# Patient Record
Sex: Female | Born: 1950 | Race: Black or African American | Hispanic: No | Marital: Single | Smoking: Never smoker
Health system: Southern US, Community
[De-identification: ages and names within clinical notes are randomized; demographics above are authoritative.]

## PROBLEM LIST (undated history)

## (undated) DIAGNOSIS — M1990 Unspecified osteoarthritis, unspecified site: Secondary | ICD-10-CM

## (undated) DIAGNOSIS — E785 Hyperlipidemia, unspecified: Secondary | ICD-10-CM

## (undated) DIAGNOSIS — T8859XA Other complications of anesthesia, initial encounter: Secondary | ICD-10-CM

## (undated) DIAGNOSIS — D649 Anemia, unspecified: Secondary | ICD-10-CM

## (undated) DIAGNOSIS — G709 Myoneural disorder, unspecified: Secondary | ICD-10-CM

## (undated) DIAGNOSIS — T4145XA Adverse effect of unspecified anesthetic, initial encounter: Secondary | ICD-10-CM

## (undated) DIAGNOSIS — M21372 Foot drop, left foot: Secondary | ICD-10-CM

## (undated) DIAGNOSIS — I10 Essential (primary) hypertension: Secondary | ICD-10-CM

## (undated) DIAGNOSIS — Z9289 Personal history of other medical treatment: Secondary | ICD-10-CM

## (undated) DIAGNOSIS — M069 Rheumatoid arthritis, unspecified: Secondary | ICD-10-CM

## (undated) HISTORY — DX: Unspecified osteoarthritis, unspecified site: M19.90

## (undated) HISTORY — DX: Hyperlipidemia, unspecified: E78.5

## (undated) HISTORY — DX: Anemia, unspecified: D64.9

## (undated) HISTORY — PX: ABDOMINAL HYSTERECTOMY: SHX81

## (undated) HISTORY — DX: Myoneural disorder, unspecified: G70.9

## (undated) HISTORY — DX: Rheumatoid arthritis, unspecified: M06.9

## (undated) HISTORY — DX: Essential (primary) hypertension: I10

## (undated) HISTORY — DX: Foot drop, left foot: M21.372

---

## 1997-07-24 DIAGNOSIS — M1990 Unspecified osteoarthritis, unspecified site: Secondary | ICD-10-CM

## 1997-07-24 HISTORY — DX: Unspecified osteoarthritis, unspecified site: M19.90

## 2006-04-05 ENCOUNTER — Ambulatory Visit (HOSPITAL_COMMUNITY): Admission: RE | Admit: 2006-04-05 | Discharge: 2006-04-05 | Payer: Self-pay | Admitting: Orthopaedic Surgery

## 2006-04-30 ENCOUNTER — Ambulatory Visit: Payer: Self-pay | Admitting: Family Medicine

## 2006-05-01 ENCOUNTER — Encounter (INDEPENDENT_AMBULATORY_CARE_PROVIDER_SITE_OTHER): Payer: Self-pay | Admitting: Family Medicine

## 2006-05-18 ENCOUNTER — Ambulatory Visit: Payer: Self-pay | Admitting: Family Medicine

## 2006-05-24 ENCOUNTER — Encounter (INDEPENDENT_AMBULATORY_CARE_PROVIDER_SITE_OTHER): Payer: Self-pay | Admitting: Family Medicine

## 2006-05-24 ENCOUNTER — Ambulatory Visit (HOSPITAL_COMMUNITY): Admission: RE | Admit: 2006-05-24 | Discharge: 2006-05-24 | Payer: Self-pay | Admitting: Family Medicine

## 2006-06-01 ENCOUNTER — Ambulatory Visit: Payer: Self-pay | Admitting: Family Medicine

## 2006-06-29 ENCOUNTER — Encounter (INDEPENDENT_AMBULATORY_CARE_PROVIDER_SITE_OTHER): Payer: Self-pay | Admitting: Family Medicine

## 2006-07-08 ENCOUNTER — Encounter: Payer: Self-pay | Admitting: Family Medicine

## 2006-07-08 DIAGNOSIS — I10 Essential (primary) hypertension: Secondary | ICD-10-CM | POA: Insufficient documentation

## 2006-07-19 ENCOUNTER — Ambulatory Visit: Payer: Self-pay | Admitting: Family Medicine

## 2006-08-16 ENCOUNTER — Ambulatory Visit: Payer: Self-pay | Admitting: Family Medicine

## 2006-08-17 ENCOUNTER — Encounter (INDEPENDENT_AMBULATORY_CARE_PROVIDER_SITE_OTHER): Payer: Self-pay | Admitting: Family Medicine

## 2006-08-24 ENCOUNTER — Ambulatory Visit: Payer: Self-pay | Admitting: Internal Medicine

## 2006-08-27 ENCOUNTER — Ambulatory Visit: Payer: Self-pay | Admitting: Internal Medicine

## 2006-09-10 ENCOUNTER — Encounter (INDEPENDENT_AMBULATORY_CARE_PROVIDER_SITE_OTHER): Payer: Self-pay | Admitting: *Deleted

## 2006-09-10 ENCOUNTER — Ambulatory Visit (HOSPITAL_COMMUNITY): Admission: RE | Admit: 2006-09-10 | Discharge: 2006-09-10 | Payer: Self-pay | Admitting: Internal Medicine

## 2006-09-10 ENCOUNTER — Ambulatory Visit: Payer: Self-pay | Admitting: Internal Medicine

## 2006-09-12 ENCOUNTER — Encounter (INDEPENDENT_AMBULATORY_CARE_PROVIDER_SITE_OTHER): Payer: Self-pay | Admitting: Family Medicine

## 2006-09-17 ENCOUNTER — Encounter (INDEPENDENT_AMBULATORY_CARE_PROVIDER_SITE_OTHER): Payer: Self-pay | Admitting: Family Medicine

## 2006-09-24 ENCOUNTER — Encounter (INDEPENDENT_AMBULATORY_CARE_PROVIDER_SITE_OTHER): Payer: Self-pay | Admitting: Family Medicine

## 2006-09-27 ENCOUNTER — Ambulatory Visit: Payer: Self-pay | Admitting: Family Medicine

## 2006-09-27 DIAGNOSIS — M069 Rheumatoid arthritis, unspecified: Secondary | ICD-10-CM | POA: Insufficient documentation

## 2006-09-27 DIAGNOSIS — D509 Iron deficiency anemia, unspecified: Secondary | ICD-10-CM

## 2006-10-30 ENCOUNTER — Encounter (INDEPENDENT_AMBULATORY_CARE_PROVIDER_SITE_OTHER): Payer: Self-pay | Admitting: Family Medicine

## 2006-11-07 ENCOUNTER — Ambulatory Visit: Payer: Self-pay | Admitting: Family Medicine

## 2006-11-07 LAB — CONVERTED CEMR LAB
Glucose, Bld: 244 mg/dL
Hemoglobin: 9.8 g/dL
Hgb A1c MFr Bld: 6.2 %

## 2006-11-12 ENCOUNTER — Ambulatory Visit (HOSPITAL_COMMUNITY): Admission: RE | Admit: 2006-11-12 | Discharge: 2006-11-12 | Payer: Self-pay | Admitting: Family Medicine

## 2006-11-16 ENCOUNTER — Encounter (INDEPENDENT_AMBULATORY_CARE_PROVIDER_SITE_OTHER): Payer: Self-pay | Admitting: Family Medicine

## 2007-01-01 ENCOUNTER — Encounter (INDEPENDENT_AMBULATORY_CARE_PROVIDER_SITE_OTHER): Payer: Self-pay | Admitting: Family Medicine

## 2007-02-06 ENCOUNTER — Ambulatory Visit: Payer: Self-pay | Admitting: Family Medicine

## 2007-02-06 LAB — CONVERTED CEMR LAB
Cholesterol, target level: 200 mg/dL
Glucose, Bld: 237 mg/dL
HDL goal, serum: 40 mg/dL

## 2007-02-07 ENCOUNTER — Encounter (INDEPENDENT_AMBULATORY_CARE_PROVIDER_SITE_OTHER): Payer: Self-pay | Admitting: Family Medicine

## 2007-02-08 ENCOUNTER — Encounter (INDEPENDENT_AMBULATORY_CARE_PROVIDER_SITE_OTHER): Payer: Self-pay | Admitting: Family Medicine

## 2007-02-08 LAB — CONVERTED CEMR LAB
AST: 16 units/L (ref 0–37)
Basophils Absolute: 0 10*3/uL (ref 0.0–0.1)
CO2: 20 meq/L (ref 19–32)
Creatinine, Ser: 0.76 mg/dL (ref 0.40–1.20)
Eosinophils Absolute: 0.2 10*3/uL (ref 0.0–0.7)
Eosinophils Relative: 3 % (ref 0–5)
Glucose, Bld: 171 mg/dL — ABNORMAL HIGH (ref 70–99)
Hemoglobin: 11.9 g/dL — ABNORMAL LOW (ref 12.0–15.0)
Lymphocytes Relative: 19 % (ref 12–46)
Lymphs Abs: 1.2 10*3/uL (ref 0.7–3.3)
MCHC: 31 g/dL (ref 30.0–36.0)
MCV: 93.4 fL (ref 78.0–100.0)
Monocytes Relative: 10 % (ref 3–11)
Platelets: 402 10*3/uL — ABNORMAL HIGH (ref 150–400)
Potassium: 4.3 meq/L (ref 3.5–5.3)
RBC: 4.11 M/uL (ref 3.87–5.11)
RDW: 14.6 % — ABNORMAL HIGH (ref 11.5–14.0)
Sodium: 139 meq/L (ref 135–145)
WBC: 6.3 10*3/uL (ref 4.0–10.5)

## 2007-02-14 ENCOUNTER — Ambulatory Visit (HOSPITAL_COMMUNITY): Admission: RE | Admit: 2007-02-14 | Discharge: 2007-02-14 | Payer: Self-pay | Admitting: Family Medicine

## 2007-02-14 ENCOUNTER — Encounter (INDEPENDENT_AMBULATORY_CARE_PROVIDER_SITE_OTHER): Payer: Self-pay | Admitting: Family Medicine

## 2007-02-18 ENCOUNTER — Telehealth (INDEPENDENT_AMBULATORY_CARE_PROVIDER_SITE_OTHER): Payer: Self-pay | Admitting: *Deleted

## 2007-02-26 ENCOUNTER — Ambulatory Visit: Payer: Self-pay | Admitting: Family Medicine

## 2007-02-26 DIAGNOSIS — M949 Disorder of cartilage, unspecified: Secondary | ICD-10-CM

## 2007-02-26 DIAGNOSIS — M899 Disorder of bone, unspecified: Secondary | ICD-10-CM | POA: Insufficient documentation

## 2007-04-02 ENCOUNTER — Encounter (INDEPENDENT_AMBULATORY_CARE_PROVIDER_SITE_OTHER): Payer: Self-pay | Admitting: Family Medicine

## 2007-05-01 ENCOUNTER — Encounter (INDEPENDENT_AMBULATORY_CARE_PROVIDER_SITE_OTHER): Payer: Self-pay | Admitting: Family Medicine

## 2007-05-03 ENCOUNTER — Encounter (INDEPENDENT_AMBULATORY_CARE_PROVIDER_SITE_OTHER): Payer: Self-pay | Admitting: Family Medicine

## 2007-05-15 ENCOUNTER — Ambulatory Visit: Payer: Self-pay | Admitting: Family Medicine

## 2007-06-05 ENCOUNTER — Encounter (INDEPENDENT_AMBULATORY_CARE_PROVIDER_SITE_OTHER): Payer: Self-pay | Admitting: Family Medicine

## 2007-07-09 ENCOUNTER — Encounter (INDEPENDENT_AMBULATORY_CARE_PROVIDER_SITE_OTHER): Payer: Self-pay | Admitting: Family Medicine

## 2007-08-15 ENCOUNTER — Ambulatory Visit: Payer: Self-pay | Admitting: Family Medicine

## 2007-08-16 ENCOUNTER — Telehealth (INDEPENDENT_AMBULATORY_CARE_PROVIDER_SITE_OTHER): Payer: Self-pay | Admitting: *Deleted

## 2007-08-19 ENCOUNTER — Encounter (INDEPENDENT_AMBULATORY_CARE_PROVIDER_SITE_OTHER): Payer: Self-pay | Admitting: Family Medicine

## 2007-08-20 ENCOUNTER — Encounter (INDEPENDENT_AMBULATORY_CARE_PROVIDER_SITE_OTHER): Payer: Self-pay | Admitting: Family Medicine

## 2007-08-21 ENCOUNTER — Telehealth (INDEPENDENT_AMBULATORY_CARE_PROVIDER_SITE_OTHER): Payer: Self-pay | Admitting: *Deleted

## 2007-08-21 LAB — CONVERTED CEMR LAB
ALT: 16 units/L (ref 0–35)
AST: 14 units/L (ref 0–37)
Albumin: 4 g/dL (ref 3.5–5.2)
Alkaline Phosphatase: 152 units/L — ABNORMAL HIGH (ref 39–117)
Basophils Relative: 0 % (ref 0–1)
CO2: 22 meq/L (ref 19–32)
Calcium: 9.5 mg/dL (ref 8.4–10.5)
Chloride: 103 meq/L (ref 96–112)
Creatinine, Ser: 0.67 mg/dL (ref 0.40–1.20)
Creatinine, Urine: 110.1 mg/dL
Eosinophils Absolute: 0.2 10*3/uL (ref 0.0–0.7)
Eosinophils Relative: 3 % (ref 0–5)
Glucose, Bld: 134 mg/dL — ABNORMAL HIGH (ref 70–99)
HDL: 40 mg/dL (ref >39)
Hemoglobin: 11.2 g/dL — ABNORMAL LOW (ref 12.0–15.0)
Lymphocytes Relative: 21 % (ref 12–46)
Microalb Creat Ratio: 7.6 mg/g (ref 0.0–30.0)
Microalb, Ur: 0.84 mg/dL (ref 0.00–1.89)
Monocytes Absolute: 0.9 10*3/uL (ref 0.1–1.0)
Monocytes Relative: 13 % — ABNORMAL HIGH (ref 3–12)
Neutrophils Relative %: 64 % (ref 43–77)
Potassium: 4 meq/L (ref 3.5–5.3)
RBC: 4.13 M/uL (ref 3.87–5.11)
RDW: 15.5 % (ref 11.5–15.5)
TSH: 1.273 microintl units/mL (ref 0.350–5.50)
Total CHOL/HDL Ratio: 4.1
Total Protein: 7.8 g/dL (ref 6.0–8.3)
Triglycerides: 119 mg/dL (ref <150)
VLDL: 24 mg/dL (ref 0–40)

## 2007-08-22 ENCOUNTER — Ambulatory Visit (HOSPITAL_COMMUNITY): Admission: RE | Admit: 2007-08-22 | Discharge: 2007-08-22 | Payer: Self-pay | Admitting: Family Medicine

## 2007-08-22 ENCOUNTER — Encounter (INDEPENDENT_AMBULATORY_CARE_PROVIDER_SITE_OTHER): Payer: Self-pay | Admitting: Family Medicine

## 2007-09-03 ENCOUNTER — Telehealth (INDEPENDENT_AMBULATORY_CARE_PROVIDER_SITE_OTHER): Payer: Self-pay | Admitting: *Deleted

## 2007-10-03 ENCOUNTER — Encounter (INDEPENDENT_AMBULATORY_CARE_PROVIDER_SITE_OTHER): Payer: Self-pay | Admitting: Family Medicine

## 2007-11-13 ENCOUNTER — Ambulatory Visit: Payer: Self-pay | Admitting: Family Medicine

## 2007-11-13 DIAGNOSIS — E1149 Type 2 diabetes mellitus with other diabetic neurological complication: Secondary | ICD-10-CM

## 2007-11-14 ENCOUNTER — Encounter (INDEPENDENT_AMBULATORY_CARE_PROVIDER_SITE_OTHER): Payer: Self-pay | Admitting: Family Medicine

## 2007-11-14 ENCOUNTER — Telehealth (INDEPENDENT_AMBULATORY_CARE_PROVIDER_SITE_OTHER): Payer: Self-pay | Admitting: *Deleted

## 2007-11-19 ENCOUNTER — Ambulatory Visit (HOSPITAL_COMMUNITY): Admission: RE | Admit: 2007-11-19 | Discharge: 2007-11-19 | Payer: Self-pay | Admitting: Family Medicine

## 2007-11-22 ENCOUNTER — Telehealth (INDEPENDENT_AMBULATORY_CARE_PROVIDER_SITE_OTHER): Payer: Self-pay | Admitting: *Deleted

## 2007-12-23 ENCOUNTER — Encounter (INDEPENDENT_AMBULATORY_CARE_PROVIDER_SITE_OTHER): Payer: Self-pay | Admitting: Family Medicine

## 2007-12-25 ENCOUNTER — Ambulatory Visit: Payer: Self-pay | Admitting: Family Medicine

## 2007-12-30 ENCOUNTER — Encounter (INDEPENDENT_AMBULATORY_CARE_PROVIDER_SITE_OTHER): Payer: Self-pay | Admitting: Family Medicine

## 2008-02-13 ENCOUNTER — Ambulatory Visit: Payer: Self-pay | Admitting: Family Medicine

## 2008-02-13 LAB — CONVERTED CEMR LAB: Hgb A1c MFr Bld: 6.6 %

## 2008-02-14 LAB — CONVERTED CEMR LAB
ALT: 15 units/L (ref 0–35)
Albumin: 3.9 g/dL (ref 3.5–5.2)
BUN: 11 mg/dL (ref 6–23)
Basophils Relative: 1 % (ref 0–1)
CO2: 21 meq/L (ref 19–32)
Chloride: 100 meq/L (ref 96–112)
Creatinine, Ser: 0.49 mg/dL (ref 0.40–1.20)
Eosinophils Absolute: 0.2 10*3/uL (ref 0.0–0.7)
Eosinophils Relative: 2 % (ref 0–5)
Glucose, Bld: 107 mg/dL — ABNORMAL HIGH (ref 70–99)
Hemoglobin: 10.1 g/dL — ABNORMAL LOW (ref 12.0–15.0)
MCV: 83.3 fL (ref 78.0–100.0)
Monocytes Absolute: 0.9 10*3/uL (ref 0.1–1.0)
Platelets: 508 10*3/uL — ABNORMAL HIGH (ref 150–400)
Sodium: 137 meq/L (ref 135–145)
WBC: 7 10*3/uL (ref 4.0–10.5)

## 2008-05-15 ENCOUNTER — Ambulatory Visit: Payer: Self-pay | Admitting: Family Medicine

## 2008-06-02 ENCOUNTER — Telehealth (INDEPENDENT_AMBULATORY_CARE_PROVIDER_SITE_OTHER): Payer: Self-pay | Admitting: *Deleted

## 2008-06-22 ENCOUNTER — Ambulatory Visit: Payer: Self-pay | Admitting: Family Medicine

## 2008-07-22 ENCOUNTER — Encounter (INDEPENDENT_AMBULATORY_CARE_PROVIDER_SITE_OTHER): Payer: Self-pay | Admitting: Family Medicine

## 2008-08-13 ENCOUNTER — Ambulatory Visit: Payer: Self-pay | Admitting: Family Medicine

## 2008-08-13 DIAGNOSIS — Z794 Long term (current) use of insulin: Secondary | ICD-10-CM

## 2008-08-13 DIAGNOSIS — E119 Type 2 diabetes mellitus without complications: Secondary | ICD-10-CM

## 2008-08-13 DIAGNOSIS — IMO0001 Reserved for inherently not codable concepts without codable children: Secondary | ICD-10-CM | POA: Insufficient documentation

## 2008-08-13 LAB — CONVERTED CEMR LAB: Hgb A1c MFr Bld: 8.4 %

## 2008-09-15 ENCOUNTER — Encounter (INDEPENDENT_AMBULATORY_CARE_PROVIDER_SITE_OTHER): Payer: Self-pay | Admitting: Family Medicine

## 2008-09-16 ENCOUNTER — Encounter (INDEPENDENT_AMBULATORY_CARE_PROVIDER_SITE_OTHER): Payer: Self-pay | Admitting: Family Medicine

## 2008-09-16 LAB — CONVERTED CEMR LAB
Albumin: 3.7 g/dL (ref 3.5–5.2)
Basophils Relative: 0 % (ref 0–1)
CO2: 19 meq/L (ref 19–32)
Calcium: 9.7 mg/dL (ref 8.4–10.5)
Chloride: 101 meq/L (ref 96–112)
Creatinine, Ser: 0.61 mg/dL (ref 0.40–1.20)
Eosinophils Absolute: 0.1 10*3/uL (ref 0.0–0.7)
Eosinophils Relative: 2 % (ref 0–5)
Glucose, Bld: 120 mg/dL — ABNORMAL HIGH (ref 70–99)
HCT: 31.7 % — ABNORMAL LOW (ref 36.0–46.0)
HDL: 44 mg/dL (ref >39)
Hemoglobin: 9.3 g/dL — ABNORMAL LOW (ref 12.0–15.0)
Lymphocytes Relative: 21 % (ref 12–46)
MCHC: 29.3 g/dL — ABNORMAL LOW (ref 30.0–36.0)
Microalb Creat Ratio: 6.3 mg/g (ref 0.0–30.0)
Microalb, Ur: 1.05 mg/dL (ref 0.00–1.89)
Monocytes Relative: 13 % — ABNORMAL HIGH (ref 3–12)
Neutro Abs: 4.9 10*3/uL (ref 1.7–7.7)
Neutrophils Relative %: 64 % (ref 43–77)
RBC: 3.53 M/uL — ABNORMAL LOW (ref 3.87–5.11)
Sodium: 137 meq/L (ref 135–145)
TSH: 1.4 microintl units/mL (ref 0.350–4.50)
Total Bilirubin: 0.3 mg/dL (ref 0.3–1.2)
Total CHOL/HDL Ratio: 3.1
Total Protein: 7.6 g/dL (ref 6.0–8.3)
VLDL: 22 mg/dL (ref 0–40)
WBC: 7.6 10*3/uL (ref 4.0–10.5)

## 2008-09-17 ENCOUNTER — Ambulatory Visit: Payer: Self-pay | Admitting: Family Medicine

## 2008-09-17 LAB — CONVERTED CEMR LAB
Retic Ct Pct: 1.5 % (ref 0.4–3.1)
Saturation Ratios: 7 % — ABNORMAL LOW (ref 20–55)
UIBC: 207 ug/dL

## 2008-11-12 ENCOUNTER — Ambulatory Visit: Payer: Self-pay | Admitting: Family Medicine

## 2008-11-12 LAB — CONVERTED CEMR LAB
Glucose, Bld: 146 mg/dL
Hgb A1c MFr Bld: 6.6 %

## 2009-01-29 ENCOUNTER — Encounter (INDEPENDENT_AMBULATORY_CARE_PROVIDER_SITE_OTHER): Payer: Self-pay | Admitting: Family Medicine

## 2009-02-11 ENCOUNTER — Ambulatory Visit: Payer: Self-pay | Admitting: Family Medicine

## 2009-02-11 LAB — CONVERTED CEMR LAB: Glucose, Bld: 196 mg/dL

## 2009-02-12 LAB — CONVERTED CEMR LAB
Albumin: 4.2 g/dL (ref 3.5–5.2)
BUN: 17 mg/dL (ref 6–23)
Calcium: 10.2 mg/dL (ref 8.4–10.5)
Glucose, Bld: 177 mg/dL — ABNORMAL HIGH (ref 70–99)
Potassium: 4 meq/L (ref 3.5–5.3)
Sodium: 138 meq/L (ref 135–145)
Total Protein: 7.3 g/dL (ref 6.0–8.3)

## 2009-02-17 ENCOUNTER — Encounter (INDEPENDENT_AMBULATORY_CARE_PROVIDER_SITE_OTHER): Payer: Self-pay | Admitting: Family Medicine

## 2009-02-17 ENCOUNTER — Ambulatory Visit (HOSPITAL_COMMUNITY): Admission: RE | Admit: 2009-02-17 | Discharge: 2009-02-17 | Payer: Self-pay | Admitting: Family Medicine

## 2009-03-25 ENCOUNTER — Ambulatory Visit: Payer: Self-pay | Admitting: Family Medicine

## 2009-09-23 ENCOUNTER — Ambulatory Visit: Payer: Self-pay | Admitting: Family Medicine

## 2009-10-13 LAB — CONVERTED CEMR LAB
ALT: 29 units/L (ref 0–35)
AST: 17 units/L (ref 0–37)
Albumin: 4.2 g/dL (ref 3.5–5.2)
BUN: 17 mg/dL (ref 6–23)
CO2: 25 meq/L (ref 19–32)
Calcium: 9.9 mg/dL (ref 8.4–10.5)
Chloride: 100 meq/L (ref 96–112)
Cholesterol: 199 mg/dL (ref 0–200)
Creatinine, Urine: 136.9 mg/dL
HCT: 39.6 % (ref 36.0–46.0)
Hgb A1c MFr Bld: 11 % — ABNORMAL HIGH (ref 4.6–6.1)
Iron: 53 ug/dL (ref 42–145)
LDL Cholesterol: 111 mg/dL — ABNORMAL HIGH (ref 0–99)
MCHC: 32.1 g/dL (ref 30.0–36.0)
Platelets: 328 10*3/uL (ref 150–400)
RBC: 4.21 M/uL (ref 3.87–5.11)
Saturation Ratios: 18 % — ABNORMAL LOW (ref 20–55)
Sodium: 138 meq/L (ref 135–145)
TSH: 1.698 microintl units/mL (ref 0.350–4.500)
Total CHOL/HDL Ratio: 3.7
Total Protein: 7.1 g/dL (ref 6.0–8.3)
Triglycerides: 168 mg/dL — ABNORMAL HIGH (ref <150)
UIBC: 241 ug/dL
VLDL: 34 mg/dL (ref 0–40)
Vit D, 25-Hydroxy: 42 ng/mL (ref 30–89)

## 2009-10-27 ENCOUNTER — Ambulatory Visit: Payer: Self-pay | Admitting: Family Medicine

## 2009-10-27 DIAGNOSIS — E785 Hyperlipidemia, unspecified: Secondary | ICD-10-CM

## 2010-01-26 ENCOUNTER — Ambulatory Visit: Payer: Self-pay | Admitting: Family Medicine

## 2010-01-26 LAB — CONVERTED CEMR LAB
AST: 25 units/L (ref 0–37)
Alkaline Phosphatase: 90 units/L (ref 39–117)
CO2: 23 meq/L (ref 19–32)
Chloride: 101 meq/L (ref 96–112)
Glucose, Bld: 271 mg/dL — ABNORMAL HIGH (ref 70–99)
HDL: 47 mg/dL (ref >39)
Hgb A1c MFr Bld: 9.1 % — ABNORMAL HIGH (ref <5.7)
Sodium: 141 meq/L (ref 135–145)
Total Protein: 7.4 g/dL (ref 6.0–8.3)
Triglycerides: 192 mg/dL — ABNORMAL HIGH (ref <150)
VLDL: 38 mg/dL (ref 0–40)

## 2010-02-25 ENCOUNTER — Ambulatory Visit (HOSPITAL_COMMUNITY): Admission: RE | Admit: 2010-02-25 | Discharge: 2010-02-25 | Payer: Self-pay | Admitting: Family Medicine

## 2010-04-27 LAB — CONVERTED CEMR LAB
ALT: 42 units/L — ABNORMAL HIGH (ref 0–35)
Albumin: 4 g/dL (ref 3.5–5.2)
Alkaline Phosphatase: 95 units/L (ref 39–117)
CO2: 27 meq/L (ref 19–32)
Cholesterol: 159 mg/dL (ref 0–200)
Glucose, Bld: 109 mg/dL — ABNORMAL HIGH (ref 70–99)
HDL: 55 mg/dL (ref >39)
Potassium: 4.1 meq/L (ref 3.5–5.3)
Sodium: 140 meq/L (ref 135–145)
Total Bilirubin: 0.3 mg/dL (ref 0.3–1.2)
Total CHOL/HDL Ratio: 2.9
Total Protein: 7.1 g/dL (ref 6.0–8.3)
Triglycerides: 123 mg/dL (ref <150)

## 2010-04-28 ENCOUNTER — Ambulatory Visit: Payer: Self-pay | Admitting: Physician Assistant

## 2010-04-28 DIAGNOSIS — H6120 Impacted cerumen, unspecified ear: Secondary | ICD-10-CM

## 2010-04-28 LAB — CONVERTED CEMR LAB
Glucose, Urine, Semiquant: 100
Nitrite: NEGATIVE
OCCULT 1: NEGATIVE
Protein, U semiquant: NEGATIVE
Specific Gravity, Urine: 1.01
WBC Urine, dipstick: NEGATIVE
pH: 5

## 2010-08-23 NOTE — Assessment & Plan Note (Signed)
Summary: physical/slj   Vital Signs:  Patient profile:   60 year old female Height:      62 inches Weight:      161.13 pounds BMI:     29.58 O2 Sat:      96 % Pulse rate:   108 / minute Resp:     16 per minute BP sitting:   144 / 82  (left arm)  Vitals Entered By: Everitt Amber LPN (April 28, 2010 1:16 PM) CC: CPE    Primary Provider:  Esperanza Sheets PA  CC:  CPE .  History of Present Illness: Pt is being seen today for a physical.  Last physical 7-10. Overall feeling well and no current complaints or concers. Hx of hyst, for bleeding.  No CA Mamm UTD .   + SBEs No routine dental care. Has dentures. Last TD uncertain  Hx of DM.  Taking medications as prescribed and denies side effects. No episodes of hypoglycemia. Checking blood sugar at home.  Fasting blood sugars average 80-110 . Last eye exam July 2011 Taking ASA daily. Pneumovax UTD.  Requests flu vaccine today.  Hx of Hyperlipidemia.  Pt did not start Crestor as prescribed.  Chol numbers have improved.  She states she started a fish oil capsule daily and is watching her diet.        Current Medications (verified): 1)  Hydrocodone-Acetaminophen 7.5-500 Mg Tabs (Hydrocodone-Acetaminophen) .... Four Times Daily As Needed 2)  Methotrexate 2.5 Mg Tabs (Methotrexate Sodium) .... Ten Tablets Per Week 3)  Exforge 10-320 Mg Tabs (Amlodipine Besylate-Valsartan) .... One By Mouth Daily 4)  Aspirin 81 Mg Tbec (Aspirin) .... Once Daily 5)  Metformin Hcl 500 Mg Tabs (Metformin Hcl) .... Two Tabs By Mouth  Two Times A Day 6)  Multivitamin .... One By Mouth Daily 7)  Folic Acid 1 Mg Tabs (Folic Acid) .... One By Mouth Daily 8)  Ferrous Sulfate 325 (65 Fe) Mg Tabs (Ferrous Sulfate) .... One By Mouth Twice Daily 9)  Toprol Xl 100 Mg Tb24 (Metoprolol Succinate) .... One Daily 10)  Caltrate 600+d Plus 600-400 Mg-Unit Tabs (Calcium Carbonate-Vit D-Min) .... One Two Times A Day 11)  Dm Shoes .... Dx. 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 Check Fsbs Once Daily Fasting 13)  Prednisone 5 Mg Tabs (Prednisone) .... One A Day Per Dr. Dareen Piano of Rheumatology 14)  Humira 40 Mg/0.60ml Kit (Adalimumab) .... One Injection Every Other Week Per Rheumatology 15)  Glimepiride 2 Mg Tabs (Glimepiride) .... One Tab By Mouth Every Morning 16)  Onglyza 5 Mg Tabs (Saxagliptin Hcl) .... Take 1 Daily For Diabetes  Allergies (verified): No Known Drug Allergies  Past History:  Past medical history reviewed for relevance to current acute and chronic problems. Past medical, surgical, family and social histories (including risk factors) reviewed, and no changes noted (except as noted below).  Past Medical History: Reviewed history from 05/15/2008 and no changes required. 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 TAH - Bleeding, no cancer - fibroids  Family History: Reviewed history from 11/12/2008 and no changes required. Father: Dead 42s Throat Cancer, OA Mother: Dead 28 Leukemia Siblings: 4 Sisters: HTN, DM, OA - not sure of ages 41 Brothers:  Dead Colon Cancer 57, others HTN KIds - none  Social History: Reviewed history from 11/12/2008 and no changes required. Single - dating now. 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 LIves with brothers Cicero Duck - 11th grade  Review of Systems General:  Denies chills, fever, and loss of appetite. Eyes:  Denies blurring and double vision. ENT:  Denies earache, nasal congestion, and sore throat. CV:  Denies chest pain or  discomfort, lightheadness, palpitations, and swelling of feet. Resp:  Denies cough and shortness of breath. GI:  Denies abdominal pain, bloody stools, change in bowel habits, constipation, dark tarry stools, diarrhea, indigestion, loss of appetite, nausea, and vomiting. GU:  Denies abnormal vaginal bleeding, dysuria, incontinence, and urinary frequency. Psych:  Denies anxiety, depression, suicidal thoughts/plans, thoughts of violence, and thoughts /plans of harming others.  Physical Exam  General:  Well-developed,well-nourished,in no acute distress; alert,appropriate and cooperative throughout examination Head:  Normocephalic and atraumatic without obvious abnormalities. No apparent alopecia or balding. Eyes:  pupils equal, pupils round, and pupils reactive to light.   Ears:  External ear exam shows no significant lesions or deformities.  Otoscopic examination reveals cerumen bilat canals, no discharge. Hearing is grossly normal bilaterally. Nose:  External nasal examination shows no deformity or inflammation. Nasal mucosa are pink and moist without lesions or exudates. Mouth:  Oral mucosa and oropharynx without lesions or exudates.  edentulous.   Neck:  No deformities, masses, or tenderness noted. Breasts:  No mass, nodules, thickening, tenderness, bulging, retraction, inflamation, nipple discharge or skin changes noted.   Lungs:  Normal respiratory effort, chest expands symmetrically. Lungs are clear to auscultation, no crackles or wheezes. Heart:  Normal rate and regular rhythm. S1 and S2 normal without gallop, murmur, click, rub or other extra sounds. Abdomen:  Bowel sounds positive,abdomen soft and non-tender without masses, organomegaly or hernias noted. Small hernia noted at Rt inferior umbilicus Rectal:  No external abnormalities noted. Normal sphincter tone. No rectal masses or tenderness. Genitalia:  normal introitus, no external lesions, and no adnexal masses or tenderness.  Uterus and  cervix absent Pulses:  R posterior tibial normal, R dorsalis pedis normal, L posterior tibial normal, and L dorsalis pedis normal.   Extremities:  No PTE Neurologic:  alert & oriented X3 and DTRs symmetrical and normal.   Skin:  Intact without suspicious lesions or rashes Cervical Nodes:  No lymphadenopathy noted Psych:  Cognition and judgment appear intact. Alert and cooperative with normal attention span and concentration. No apparent delusions, illusions, hallucinations   Impression & Recommendations:  Problem # 1:  Preventive Health Care (ICD-V70.0) Assessment Comment Only  Problem # 2:  DIABETES MELLITUS, TYPE II, UNCONTROLLED (ICD-250.02) Assessment: Improved  The following medications were removed from the medication list:    Metformin Hcl 500 Mg Tabs (Metformin hcl) .Marland Kitchen..Marland Kitchen Two tabs by mouth  two times a day    Onglyza 5 Mg Tabs (Saxagliptin hcl) .Marland Kitchen... Take 1 daily for diabetes Her updated medication list for this problem includes:    Exforge 10-320 Mg Tabs (Amlodipine besylate-valsartan) ..... One by mouth daily    Aspirin 81 Mg Tbec (Aspirin) ..... Once daily    Glimepiride 2 Mg Tabs (Glimepiride) ..... One tab by mouth every morning    Kombiglyze Xr 2.11-998 Mg Xr24h-tab (Saxagliptin-metformin) .Marland Kitchen... Take 1 two times a day for diabetes  Labs reviewed and discussed with pt. HgbA1c decreased from 9.1 to 7.8  Orders: T-Comprehensive Metabolic Panel [id number])  T- Hemoglobin A1C ([address])  Problem # 3:  HYPERTENSION (ICD-401.9) Assessment: Comment Only  Her updated medication list for this problem includes:    Exforge 10-320 Mg Tabs (Amlodipine besylate-valsartan) ..... One by mouth daily    Toprol Xl 100 Mg Tb24 (Metoprolol succinate) ..... One daily  BP today: 144/82 Prior BP: 150/80 (01/26/2010)  Prior 10 Yr Risk Heart Disease: 15 % (11/13/2007)  Labs Reviewed: K+: 4.1 (01/21/2010) Creat: : 0.73 (01/21/2010)   Chol: 198 (01/21/2010)   HDL: 47 (01/21/2010)    LDL: 113 (01/21/2010)   TG: 192 (01/21/2010)  Orders: UA Dipstick W/ Micro (manual) (09811) T-Comprehensive Metabolic Panel ([id number])  Problem # 4:  HYPERLIPIDEMIA (ICD-272.4) Assessment: Improved  Improved with diet changes and fish oil daily.  Advised pt will monitor and not to start Crestor yet.  The following medications were removed from the medication list:    Crestor 10 Mg Tabs (Rosuvastatin calcium) .Marland Kitchen... Take 1 daily for cholesterol  Orders: T-Lipid Profile ([address]-57846)  Complete Medication List: 1)  Hydrocodone-acetaminophen 7.5-500 Mg Tabs (Hydrocodone-acetaminophen) .... Four times daily as needed 2)  Methotrexate 2.5 Mg Tabs (Methotrexate sodium) .... Ten tablets per week 3)  Exforge 10-320 Mg Tabs (Amlodipine besylate-valsartan) .... One by mouth daily 4)  Aspirin 81 Mg Tbec (Aspirin) .... Once daily 5)  Multivitamin  .... One by mouth daily 6)  Folic Acid 1 Mg Tabs (Folic acid) .... One by mouth daily 7)  Ferrous Sulfate 325 (65 Fe) Mg Tabs (Ferrous sulfate) .... One by mouth twice daily 8)  Toprol Xl 100 Mg Tb24 (Metoprolol succinate) .... One daily 9)  Caltrate 600+d Plus 600-400 Mg-unit Tabs (Calcium carbonate-vit d-min) .... One two times a day 10)  Dm Shoes  .... Dx. niddm with ra and left little toe pre-ulcerative callous 11)  Accu-chek Aviva Strp (Glucose blood) .... Check fsbs once daily fasting 12)  Prednisone 5 Mg Tabs (Prednisone) .... One a day per dr. Dareen Piano of rheumatology 13)  Humira 40 Mg/0.71ml Kit (Adalimumab) .... One injection every other week per rheumatology 14)  Glimepiride 2 Mg Tabs (Glimepiride) .... One tab by mouth every morning 15)  Kombiglyze Xr 2.11-998 Mg Xr24h-tab (Saxagliptin-metformin) .... Take 1 two times a day for diabetes  Other Orders: Influenza Vaccine NON MCR (96295) Cerumen Impaction Removal (28413)  Patient Instructions: 1)  Return for ear irrigation in one week. Use an over the counter wax softener drop to loosen  the ear wax in your ears between now and then. 2)  Please schedule a follow-up appointment in 3 months. 3)  Your Metformin and Onglyza are now combined in one pill called Kombiglyze.  You will take one Kombiglyze pill two times a day. 4)  Have blood work drawn fasting in 3 mos before your next appt. Prescriptions: EXFORGE 10-320 MG TABS (AMLODIPINE BESYLATE-VALSARTAN) One by mouth daily  #30 Each x 3   Entered and Authorized by:   Esperanza Sheets PA   Signed by:   Esperanza Sheets PA on 04/28/2010   Method used:   Electronically to        [facility]* (retail)       [address]       [place], [place]  [address]       Ph: [phone]       Fax: [phone]   RxID:   [id number] FERROUS SULFATE 325 (65 FE) MG TABS (FERROUS SULFATE) One by mouth twice daily  #60  x 6   Entered and Authorized by:   Esperanza Sheets PA   Signed by:   Esperanza Sheets PA on 04/28/2010   Method used:   Electronically to        [facility] (retail)       [address]       [place], [place]  [address]       Ph: [phone]       Fax: [phone]   RxID:   [id number] TOPROL XL 100 MG TB24 (METOPROLOL SUCCINATE) One daily  #30 Each x 3   Entered and Authorized by:   Esperanza Sheets PA   Signed by:   Esperanza Sheets PA on 04/28/2010   Method used:   Electronically to        [facility] (retail)       [address] [place], [place]  [address]       Ph: [phone]       Fax: [phone]   RxID:   [id number] GLIMEPIRIDE 2 MG TABS (GLIMEPIRIDE) one tab by mouth every morning  #30 Each x 3   Entered and Authorized by:   Esperanza Sheets PA   Signed by:   Esperanza Sheets PA on 04/28/2010   Method used:   Electronically to        [facility] (retail)       [address]       [place], [place]  [address]       Ph: [phone]       Fax: [phone]   RxID:    [id number] KOMBIGLYZE XR 2.11-998 MG XR24H-TAB (SAXAGLIPTIN-METFORMIN) take 1 two times a day for diabetes  #60 x 3   Entered and Authorized by:   Esperanza Sheets PA   Signed by:   Esperanza Sheets PA on 04/28/2010   Method used:   Electronically to        [facility] (retail)       [address]       [place], [place]  [address]       Ph: [phone]       Fax: [phone]   RxID:   [id number]   Laboratory Results   Urine Tests    Routine Urinalysis   Color: straw Glucose: 100   (Normal Range: Negative) Bilirubin: negative   (Normal Range: Negative) Ketone: negative   (Normal Range: Negative) Spec. Gravity: 1.010   (Normal Range: 1.003-1.035) Blood: negative   (Normal Range: Negative) pH: 5.0   (Normal Range: 5.0-8.0) Protein: negative   (Normal Range: Negative) Urobilinogen: 0.2   (Normal Range: 0-1) Nitrite: negative   (Normal Range: Negative) Leukocyte Esterace: negative   (Normal Range: Negative)      Stool - Occult Blood Hemmoccult #1: negative Date: 04/28/2010      Influenza Vaccine    Vaccine Type: Fluvax Non-MCR    Site: left deltoid    Mfr: novartis    Dose: 0.5 ml    Route: IM    Given by: Everitt Amber LPN    Exp. Date: 11/2010    Lot #: [id number]

## 2010-08-23 NOTE — Assessment & Plan Note (Signed)
Summary: follow up- room 1   Vital Signs:  Patient profile:   60 year old female Height:      62 inches Weight:      165.75 pounds BMI:     30.43 O2 Sat:      97 % on Room air Pulse rate:   94 / minute Resp:     16 per minute BP sitting:   150 / 80  (left arm)  Vitals Entered By: Adella Hare LPN (January 26, 1609 1:18 PM)  Nutrition Counseling: Patient's BMI is greater than 25 and therefore counseled on weight management options. CC: follow up- room 1 Is Patient Diabetic? Yes Did you bring your meter with you today? No Pain Assessment Patient in pain? no      Comments did not bring meds to ov   Primary Provider:  Esperanza Sheets PA  CC:  follow up- room 1.  History of Present Illness: Pt is here today for check up.  Hx of DM.  Taking medications as prescribed. No episodes of hypoglycemia. Checking blood sugar at home.  States this morning it was 120's.  Have been less than 200 when checks at home since the change in her meds.  Reviewed labs with pt.  She states she did check her BS at home the morning she had labs done & did not read in the 200's.  The meter that she has is a couple yrs old, and I'm wondering if it is accurate. Eye exam appt scheduled for this mos. Taking ASA daily.  Hx of htn. Taking medications as prescribed, except has been out of Exforge the last 2 days. No headache, chest pain or palpitations.      Allergies (verified): No Known Drug Allergies  Past History:  Past medical history reviewed for relevance to current acute and chronic problems.  Past Medical History: Reviewed history from 05/15/2008 and no changes required. 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 Resp:  Denies cough and shortness of breath. GI:  Denies abdominal pain, loss of appetite, nausea, and vomiting. MS:  Complains of joint pain and joint swelling.  Physical Exam  General:  Well-developed,well-nourished,in no acute distress; alert,appropriate and cooperative throughout examination Head:  Normocephalic and atraumatic without obvious abnormalities. No apparent alopecia or balding. Ears:  External ear exam shows no significant lesions or deformities.  Otoscopic examination reveals clear canals, tympanic membranes are intact bilaterally without bulging, retraction, inflammation or discharge. Hearing is grossly normal bilaterally. Nose:  External nasal examination shows no deformity or inflammation. Nasal mucosa are pink and moist without lesions or exudates. Mouth:  Oral mucosa and oropharynx without lesions or exudates.   Neck:  No deformities, masses, or tenderness noted. Lungs:  Normal respiratory effort, chest expands symmetrically. Lungs are clear to auscultation, no crackles or wheezes. Heart:  Normal rate and regular rhythm. S1 and S2 normal without gallop, murmur, click, rub or other extra sounds. Pulses:  R posterior tibial normal, R dorsalis pedis normal, L posterior tibial normal, and L dorsalis pedis normal.   Extremities:  No PTE Neurologic:  alert & oriented X3, sensation intact to light touch, and gait normal.   Cervical Nodes:  No lymphadenopathy noted Psych:  Cognition and judgment appear intact. Alert  and cooperative with normal attention span and concentration. No apparent delusions, illusions, hallucinations  Diabetes Management Exam:    Foot Exam (with socks and/or shoes not present):       Sensory-Monofilament:          Left foot: normal          Right foot: normal       Inspection:          Left foot: normal          Right foot: normal       Nails:          Left foot:  normal          Right foot: normal   Impression & Recommendations:  Problem # 1:  DIABETES MELLITUS, TYPE II, UNCONTROLLED (ICD-250.02) Assessment Deteriorated  Her updated medication list for this problem includes:    Exforge 10-320 Mg Tabs (Amlodipine besylate-valsartan) ..... One by mouth daily    Aspirin 81 Mg Tbec (Aspirin) ..... Once daily    Metformin Hcl 500 Mg Tabs (Metformin hcl) .Marland Kitchen..Marland Kitchen Two tabs by mouth  two times a day    Glimepiride 2 Mg Tabs (Glimepiride) ..... One tab by mouth every morning    Onglyza 5 Mg Tabs (Saxagliptin hcl) .Marland Kitchen... Take 1 daily for diabetes  Orders: T-CMP with estimated GFR ([address]) T- Hemoglobin A1C ([address]78-[address]562)  Problem # 2:  HYPERTENSION (ICD-401.9) Assessment: Comment Only Out of Exforge x 2 days.  Her updated medication list for this problem includes:    Exforge 10-320 Mg Tabs (Amlodipine besylate-valsartan) ..... One by mouth daily    Toprol Xl 100 Mg Tb24 (Metoprolol succinate) ..... One daily  BP today: 150/80 Prior BP: 124/80 (10/27/2009)  Prior 10 Yr Risk Heart Disease: 15 % (11/13/2007)  Labs Reviewed: K+: 3.7 (10/12/2009) Creat: : 0.82 (10/12/2009)   Chol: 199 (10/12/2009)   HDL: 54 (10/12/2009)   LDL: 111 (10/12/2009)   TG: 168 (10/12/2009)  Problem # 3:  HYPERLIPIDEMIA (ICD-272.4) Assessment: New  Her updated medication list for this problem includes:    Crestor 10 Mg Tabs (Rosuvastatin calcium) .Marland Kitchen... Take 1 daily for cholesterol  Orders: T-CMP with estimated GFR ([address]) T-Lipid Profile ([address]29-52841)  Complete Medication List: 1)  Hydrocodone-acetaminophen 7.5-500 Mg Tabs (Hydrocodone-acetaminophen) .... Four times daily as needed 2)  Methotrexate 2.5 Mg Tabs (Methotrexate sodium) .... Ten tablets per week 3)  Exforge 10-320 Mg Tabs (Amlodipine besylate-valsartan) .... One by mouth daily 4)  Aspirin 81 Mg Tbec (Aspirin) .... Once daily 5)  Metformin Hcl 500 Mg Tabs (Metformin hcl) .... Two tabs by mouth  two  times a day 6)  Multivitamin  .... One by mouth daily 7)  Folic Acid 1 Mg Tabs (Folic acid) .... One by mouth daily 8)  Ferrous Sulfate 325 (65 Fe) Mg Tabs (Ferrous sulfate) .... One by mouth twice daily 9)  Toprol Xl 100 Mg Tb24 (Metoprolol succinate) .... One daily 10)  Caltrate 600+d Plus 600-400 Mg-unit Tabs (Calcium carbonate-vit d-min) .... One two times a day 11)  Dm Shoes  .... Dx. niddm with ra and left little toe pre-ulcerative callous 12)  Accu-chek Aviva Strp (Glucose blood) .... Check fsbs once daily fasting 13)  Prednisone 5 Mg Tabs (Prednisone) .... One a day per dr. Dareen Piano of rheumatology 14)  Humira 40 Mg/0.40ml Kit (Adalimumab) .... One injection every other week per rheumatology 15)  Glimepiride 2 Mg Tabs (Glimepiride) .... One tab by mouth every morning 16)  Onglyza 5 Mg Tabs (  Saxagliptin hcl) .... Take 1 daily for diabetes 17)  Crestor 10 Mg Tabs (Rosuvastatin calcium) .... Take 1 daily for cholesterol  Patient Instructions: 1)  Please schedule a follow-up appointment in 3 months for a physical. 2)  It is important that you exercise regularly at least 20 minutes 5 times a week. If you develop chest pain, have severe difficulty breathing, or feel very tired , stop exercising immediately and seek medical attention. 3)  You need to lose weight. Consider a lower calorie diet and regular exercise.  4)  I have added Onglyza for your blood sugar.  I have added Crestor for your cholesterol. Prescriptions: GLIMEPIRIDE 2 MG TABS (GLIMEPIRIDE) one tab by mouth every morning  #30 Each x 3   Entered and Authorized by:   Esperanza Sheets PA   Signed by:   Esperanza Sheets PA on 01/26/2010   Method used:   Electronically to        [facility]* (retail)       [address] [place], [place]  [address]       Ph: [phone]       Fax: [phone]   RxID:   [id number] TOPROL XL 100 MG TB24 (METOPROLOL SUCCINATE) One daily  #30 Each x 3    Entered and Authorized by:   Esperanza Sheets PA   Signed by:   Esperanza Sheets PA on 01/26/2010   Method used:   Electronically to        [facility] (retail)       [address]       [place], [place]  [address]       Ph: [phone]       Fax: [phone]   RxID:   [id number] METFORMIN HCL 500 MG TABS (METFORMIN HCL) two tabs by mouth  two times a day  #120 Each x 3   Entered and Authorized by:   Esperanza Sheets PA   Signed by:   Esperanza Sheets PA on 01/26/2010   Method used:   Electronically to        [facility] (retail)       [address] [place], [place]  [address]       Ph: [phone]       Fax: [phone]   RxID:   [id number] EXFORGE 10-320 MG TABS (AMLODIPINE BESYLATE-VALSARTAN) One by mouth daily  #30 Each x 3   Entered and Authorized by:   Esperanza Sheets PA   Signed by:   Esperanza Sheets PA on 01/26/2010   Method used:   Electronically to        [facility] (retail)       [address]       [place], [place]  [address]       Ph: [phone]       Fax: [phone]   RxID:   [id number] CRESTOR 10 MG TABS (ROSUVASTATIN CALCIUM) take 1 daily for cholesterol  #30 x 3   Entered and Authorized by:   Esperanza Sheets PA   Signed by:   Esperanza Sheets PA on 01/26/2010   Method used:   Electronically to        [facility] (retail)  [address]       [place], [place]  [address]       Ph: [phone]       Fax: [phone]   RxID:   [id number] ONGLYZA 5 MG TABS (SAXAGLIPTIN HCL) take 1 daily for diabetes  #30 x 3   Entered and Authorized by:   Esperanza Sheets PA   Signed by:   Esperanza Sheets PA on 01/26/2010   Method used:   Electronically to        [facility] (retail)       [address]       [place], [place]  [address]       Ph: [phone]       Fax: [phone]   RxID:    [id number]

## 2010-08-23 NOTE — Miscellaneous (Signed)
Summary: Orders Update  Clinical Lists Changes  Orders: Added new Test order of Dexa scan (Dexa scan) - Signed

## 2010-08-23 NOTE — Assessment & Plan Note (Signed)
Summary: follow up- room 2   Vital Signs:  Patient profile:   60 year old female Height:      62 inches Weight:      164 pounds BMI:     30.10 O2 Sat:      98 % on Room air Pulse rate:   82 / minute Resp:     16 per minute BP sitting:   124 / 80  (left arm)  Vitals Entered By: Adella Hare LPN (October 27, 3233 9:06 AM)  Nutrition Counseling: Patient's BMI is greater than 25 and therefore counseled on weight management options. CC: follow up Is Patient Diabetic? Yes Did you bring your meter with you today? No Pain Assessment Patient in pain? no        Primary Provider:  Esperanza Sheets PA  CC:  follow up.  History of Present Illness: Pt is here today for f/u of her DM.  Taking medications as prescribed. I recently increased her metformin & added glimepiride. No episodes of hypoglycemia. Since medication adjustment pt has notced that her FBS have decreased.  Prior were running in 200's. Now pt brings in readings 115-143.  Evening readings 118-141. Last eye exam  1 yr ago. Taking ASA daily. Pneumovax UTD.  Also need to discuss Lipid results. Pt is not on chol medication.  States she has been told in the past that her chol levels are good.  Hx of htn. Taking medications as prescribed. No headache, chest pain or palpitations.  doesnt need refills at this time.       Current Medications (verified): 1)  Hydrocodone-Acetaminophen 7.5-500 Mg Tabs (Hydrocodone-Acetaminophen) .... Four Times Daily As Needed 2)  Methotrexate 2.5 Mg Tabs (Methotrexate Sodium) .... Ten Tablets Per Week 3)  Exforge 10-320 Mg Tabs (Amlodipine Besylate-Valsartan) .... One By Mouth Daily 4)  Aspirin 81 Mg Tbec (Aspirin) .... Once Daily 5)  Metformin Hcl 500 Mg Tabs (Metformin Hcl) .... Two Tabs By Mouth  Two Times A Day 6)  Multivitamin .... One By Mouth Daily 7)  Folic Acid 1 Mg Tabs (Folic Acid) .... One By Mouth Daily 8)  Ferrous Sulfate 325 (65 Fe) Mg Tabs (Ferrous Sulfate) .... One By Mouth  Twice Daily 9)  Toprol Xl 100 Mg Tb24 (Metoprolol Succinate) .... One Daily 10)  Caltrate 600+d Plus 600-400 Mg-Unit Tabs (Calcium Carbonate-Vit D-Min) .... One Two Times A Day 11)  Dm Shoes .... Dx. 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 Check Fsbs Once Daily Fasting 13)  Prednisone 5 Mg Tabs (Prednisone) .... One A Day Per Dr. Dareen Piano of Rheumatology 14)  Humira 40 Mg/0.85ml Kit (Adalimumab) .... One Injection Every Other Week Per Rheumatology 15)  Glimepiride 2 Mg Tabs (Glimepiride) .... One Tab By Mouth Every Morning  Allergies (verified): No Known Drug Allergies  Review of Systems CV:  Denies chest pain or discomfort and palpitations. Resp:  Denies shortness of breath. Endo:  Denies excessive thirst.  Physical Exam  General:  Well-developed,well-nourished,in no acute distress; alert,appropriate and cooperative throughout examination Head:  Normocephalic and atraumatic without obvious abnormalities. No apparent alopecia or balding. Ears:  External ear exam shows no significant lesions or deformities.  Otoscopic examination reveals clear canals, tympanic membranes are intact bilaterally without bulging, retraction, inflammation or discharge. Hearing is grossly normal bilaterally. Nose:  External nasal examination shows no deformity or inflammation. Nasal mucosa are pink and moist without lesions or exudates. Mouth:  Oral  mucosa and oropharynx without lesions or exudates.  teeth missing.   Neck:  No deformities, masses, or tenderness noted.no thyromegaly.   Lungs:  Normal respiratory effort, chest expands symmetrically. Lungs are clear to auscultation, no crackles or wheezes. Heart:  Normal rate and regular rhythm. S1 and S2 normal without gallop, murmur, click, rub or other extra sounds. Cervical Nodes:  No lymphadenopathy noted Psych:  Cognition and judgment appear intact. Alert and cooperative with normal attention  span and concentration. No apparent delusions, illusions, hallucinations   Impression & Recommendations:  Problem # 1:  DIABETES MELLITUS, TYPE II, UNCONTROLLED (ICD-250.02) Assessment Improved continue current meds Advised pt if rheum d/c prednisone in future we will probably need to decrease or d/c some of her diabetic meds.  She is to let us know if the happens.  Her updated medication list for this problem includes:    Exforge 10-320 Mg Tabs (Amlodipine besylate-valsartan) ..... One by mouth daily    Aspirin 81 Mg Tbec (Aspirin) ..... Once daily    Metformin Hcl 500 Mg Tabs (Metformin hcl) .Marland Kitchen..Marland Kitchen Two tabs by mouth  two times a day    Glimepiride 2 Mg Tabs (Glimepiride) ..... One tab by mouth every morning  Orders: T-Comprehensive Metabolic Panel [phone]) T- Hemoglobin A1C ([id number])  Problem # 2:  HYPERLIPIDEMIA (ICD-272.4) Assessment: New Discussed chol labs. Pt will try diet/lifestyle modification first.  Labs Reviewed: SGOT: 17 (10/12/2009)   SGPT: 29 (10/12/2009)  Lipid Goals: Chol Goal: 200 (02/06/2007)   HDL Goal: 40 (02/06/2007)   LDL Goal: 100 (02/06/2007)   TG Goal: 150 (02/06/2007)  Prior 10 Yr Risk Heart Disease: 15 % (11/13/2007)   HDL:54 (10/12/2009), 44 (09/15/2008)  LDL:111 (10/12/2009), 70 (29/56/2130)  Chol:199 (10/12/2009), 136 (09/15/2008)  Trig:168 (10/12/2009), 112 (09/15/2008)  Orders: T-Comprehensive Metabolic Panel ([id number]) T-Lipid Profile ([id number])  Problem # 3:  HYPERTENSION (ICD-401.9) Assessment: Improved  Her updated medication list for this problem includes:    Exforge 10-320 Mg Tabs (Amlodipine besylate-valsartan) ..... One by mouth daily    Toprol Xl 100 Mg Tb24 (Metoprolol succinate) ..... One daily  BP today: 124/80 Prior BP: 140/80 (09/23/2009)  Prior 10 Yr Risk Heart Disease: 15 % (11/13/2007)  Labs Reviewed: K+: 3.7 (10/12/2009) Creat: : 0.82 (10/12/2009)   Chol: 199 (10/12/2009)   HDL: 54 (10/12/2009)   LDL:  111 (10/12/2009)   TG: 168 (10/12/2009)  Orders: T-Comprehensive Metabolic Panel ([id number])  Complete Medication List: 1)  Hydrocodone-acetaminophen 7.5-500 Mg Tabs (Hydrocodone-acetaminophen) .... Four times daily as needed 2)  Methotrexate 2.5 Mg Tabs (Methotrexate sodium) .... Ten tablets per week 3)  Exforge 10-320 Mg Tabs (Amlodipine besylate-valsartan) .... One by mouth daily 4)  Aspirin 81 Mg Tbec (Aspirin) .... Once daily 5)  Metformin Hcl 500 Mg Tabs (Metformin hcl) .... Two tabs by mouth  two times a day 6)  Multivitamin  .... One by mouth daily 7)  Folic Acid 1 Mg Tabs (Folic acid) .... One by mouth daily 8)  Ferrous Sulfate 325 (65 Fe) Mg Tabs (Ferrous sulfate) .... One by mouth twice daily 9)  Toprol Xl 100 Mg Tb24 (Metoprolol succinate) .... One daily 10)  Caltrate 600+d Plus 600-400 Mg-unit Tabs (Calcium carbonate-vit d-min) .... One two times a day 11)  Dm Shoes  .... Dx. niddm with ra and left little toe pre-ulcerative callous 12)  Accu-chek Aviva Strp (Glucose blood) .... Check fsbs once daily fasting 13)  Prednisone 5 Mg Tabs (Prednisone) .... One a day per dr. Dareen Piano of rheumatology 14)  Humira 40 Mg/0.26ml Kit (Adalimumab) .... One injection every other week per rheumatology 15)  Glimepiride 2 Mg Tabs (Glimepiride) .... One tab by mouth every morning  Patient Instructions: 1)  Please schedule a follow-up appointment in 3 months. 2)  It is important that you exercise regularly at least 20 minutes 5 times a week. If you develop chest pain, have severe difficulty breathing, or feel very tired , stop exercising immediately and seek medical attention. 3)  You need to lose weight. Consider a lower calorie diet and regular exercise.  4)  Continue your current medications. 5)  I have ordered blood work for you. Please have this done fasting about 1 wk before your next appt. 6)  See your eye doctor yearly to check for diabetic eye damage. 7)  I have given you cholesterol  diet information.  We will recheck your levels on your next blood work.  If the numbers don't improve I will need to start you on medication to lower your cholesterol.

## 2010-08-23 NOTE — Assessment & Plan Note (Signed)
Summary: new patient- room 2   Vital Signs:  Patient profile:   60 year old female Height:      62 inches Weight:      163.25 pounds BMI:     29.97 O2 Sat:      98 % on Room air Pulse rate:   106 / minute Resp:     16 per minute BP sitting:   140 / 80  (left arm)  Vitals Entered By: Adella Hare LPN (September 23, 1608 1:34 PM)  Serial Vital Signs/Assessments:  Time      Position  BP       Pulse  Resp  Temp     By                     118/84                         Esperanza Sheets PA  CC: new patient Is Patient Diabetic? Yes Did you bring your meter with you today? No Pain Assessment Patient in pain? yes     Location: right knee Intensity: 6 Type: aching Onset of pain  Chronic   Primary Provider:  Esperanza Sheets PA  CC:  new patient.  History of Present Illness: New Patient here to establish care with PCP.  RA - see's rheumatologist.  DM - FBS 193 today.  Has been running high with Prednisone, has been on x couple of mos now & is uncertain how long she will be taking it.  No low BS episodes.  Pt states she is due for her blood work for DM.  Last eye exam 1 yr ago.  Also hx of HTN. She is taking her meds daily.  No side effects or probs with.  No chest pain, pressure or difficulty breathing.   Her last mamm was in July 2010.  Bone Denisity test is UTD. Pt states she has had a Pneumovax in the past.  Did not get her flu shot this yr, but has in the past.     Current Medications (verified): 1)  Hydrocodone-Acetaminophen 7.5-500 Mg Tabs (Hydrocodone-Acetaminophen) .... Four Times Daily As Needed 2)  Methotrexate 2.5 Mg Tabs (Methotrexate Sodium) .... Ten Tablets Per Week 3)  Exforge 10-320 Mg Tabs (Amlodipine Besylate-Valsartan) .... One By Mouth Daily 4)  Aspirin 81 Mg Tbec (Aspirin) .... Once Daily 5)  Metformin Hcl 500 Mg Tabs (Metformin Hcl) .... Two Times A Day 6)  Multivitamin .... One By Mouth Daily 7)  Folic Acid 1 Mg Tabs (Folic Acid) .... One By Mouth Daily 8)   Ferrous Sulfate 325 (65 Fe) Mg Tabs (Ferrous Sulfate) .... One By Mouth Twice Daily 9)  Toprol Xl 100 Mg Tb24 (Metoprolol Succinate) .... One Daily 10)  Caltrate 600+d Plus 600-400 Mg-Unit Tabs (Calcium Carbonate-Vit D-Min) .... One Two Times A Day 11)  Dm Shoes .... Dx. 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 Check Fsbs Once Daily Fasting 13)  Prednisone 5 Mg Tabs (Prednisone) .... One A Day Per Dr. Dareen Piano of Rheumatology 14)  Humira 40 Mg/0.27ml Kit (Adalimumab) .... One Injection Every Other Week Per Rheumatology  Allergies (verified): No Known Drug Allergies  Past History:  Past medical, surgical, family and social histories (including risk factors) reviewed for relevance to current acute and chronic problems.  Past Medical History: Reviewed history from 05/15/2008 and no changes required. Current  Problems:  DIABETIC PERIPHERAL NEUROPATHY (ICD-250.60) ENCOUNTER FOR LONG-TERM USE OF OTHER MEDICATIONS (ICD-V58.69) OSTEOPENIA (ICD-733.90) ARTHRITIS, RHEUMATOID (ICD-714.0) HYPERTENSION (ICD-401.9) DIABETES MELLITUS, TYPE II, CONTROLLED (ICD-250.00) ANEMIA, IRON DEFICIENCY (ICD-280.9)  Past Surgical History: Reviewed history from 11/07/2006 and no changes required. TAH - Bleeding, no cancer - fibroids  Family History: Reviewed history from 11/12/2008 and no changes required. Father: Dead 38s Throat Cancer, OA Mother: Dead 32 Leukemia Siblings: 4 Sisters: HTN, DM, OA - not sure of ages 52 Brothers: Dead Colon Cancer 50, others 54 and 50 W&L KIds - none  Social History: Reviewed history from 11/12/2008 and no changes required. Single - dating now. 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 LIves with brothers Cicero Duck - 11th grade  Review of Systems General:  Denies chills, fatigue, and fever. ENT:  Denies earache, sinus pressure, and sore  throat. CV:  Denies chest pain or discomfort, palpitations, and swelling of feet. Resp:  Denies cough and shortness of breath. Endo:  Denies excessive thirst, excessive urination, and polyuria. Heme:  Denies enlarge lymph nodes and fevers.  Physical Exam  General:  Well-developed,well-nourished,in no acute distress; alert,appropriate and cooperative throughout examination Head:  Normocephalic and atraumatic without obvious abnormalities. No apparent alopecia or balding. Ears:  External ear exam shows no significant lesions or deformities.  Otoscopic examination reveals clear canals, tympanic membranes are intact bilaterally without bulging, retraction, inflammation or discharge. Hearing is grossly normal bilaterally. Nose:  External nasal examination shows no deformity or inflammation. Nasal mucosa are pink and moist without lesions or exudates. Mouth:  Oral mucosa and oropharynx without lesions or exudates.   Neck:  No deformities, masses, or tenderness noted. Lungs:  Normal respiratory effort, chest expands symmetrically. Lungs are clear to auscultation, no crackles or wheezes. Heart:  Normal rate and regular rhythm. S1 and S2 normal without gallop, murmur, click, rub or other extra sounds. Neurologic:  alert & oriented X3, sensation intact to light touch, and gait normal.   Cervical Nodes:  No lymphadenopathy noted Psych:  Cognition and judgment appear intact. Alert and cooperative with normal attention span and concentration. No apparent delusions, illusions, hallucinations  Diabetes Management Exam:    Foot Exam (with socks and/or shoes not present):       Sensory-Monofilament:          Left foot: normal          Right foot: normal       Inspection:          Left foot: normal          Right foot: normal       Nails:          Left foot: normal          Right foot: normal   Impression & Recommendations:  Problem # 1:  DIABETES MELLITUS, TYPE II, UNCONTROLLED (ICD-250.02)  Her  updated medication list for this problem includes:    Exforge 10-320 Mg Tabs (Amlodipine besylate-valsartan) ..... One by mouth daily    Aspirin 81 Mg Tbec (Aspirin) ..... Once daily    Metformin Hcl 500 Mg Tabs (Metformin hcl) .Marland Kitchen..Marland Kitchen Two times a day  Orders: T-Comprehensive Metabolic Panel [id number]) T-Lipid Profile [id number]) T- Hemoglobin A1C ([id number]) T-Urine Microalbumin w/creat. ratio [phone])  Problem # 2:  HYPERTENSION (ICD-401.9) Assessment: Unchanged  Her updated medication list for this problem includes:    Exforge 10-320 Mg Tabs (Amlodipine besylate-valsartan) ..... One by mouth daily  Toprol Xl 100 Mg Tb24 (Metoprolol succinate) ..... One daily  Orders: T-Comprehensive Metabolic Panel [phone])  BP today: 140/80 Prior BP: 131/74 (03/25/2009)  Prior 10 Yr Risk Heart Disease: 15 % (11/13/2007)  Labs Reviewed: K+: 4.0 (02/11/2009) Creat: : 0.72 (02/11/2009)   Chol: 136 (09/15/2008)   HDL: 44 (09/15/2008)   LDL: 70 (09/15/2008)   TG: 112 (09/15/2008)  Problem # 3:  ARTHRITIS, RHEUMATOID (ICD-714.0) Assessment: Comment Only  Her updated medication list for this problem includes:    Methotrexate 2.5 Mg Tabs (Methotrexate sodium) .Marland Kitchen... Ten tablets per week    Aspirin 81 Mg Tbec (Aspirin) ..... Once daily    Prednisone 5 Mg Tabs (Prednisone) ..... One a day per dr. Dareen Piano of rheumatology    Humira 40 Mg/0.42ml Kit (Adalimumab) ..... One injection every other week per rheumatology  Orders: T-TSH [id number]) T-Vitamin D (25-Hydroxy) [phone])  Problem # 4:  ANEMIA, IRON DEFICIENCY (ICD-280.9) Assessment: Unchanged  Her updated medication list for this problem includes:    Folic Acid 1 Mg Tabs (Folic acid) ..... One by mouth daily    Ferrous Sulfate 325 (65 Fe) Mg Tabs (Ferrous sulfate) ..... One by mouth twice daily  Orders: T-CBC No Diff ([id number]) T-Iron/TIBC [facility]) [id number] [facility])  Complete Medication List: 1)   Hydrocodone-acetaminophen 7.5-500 Mg Tabs (Hydrocodone-acetaminophen) .... Four times daily as needed 2)  Methotrexate 2.5 Mg Tabs (Methotrexate sodium) .... Ten tablets per week 3)  Exforge 10-320 Mg Tabs (Amlodipine besylate-valsartan) .... One by mouth daily 4)  Aspirin 81 Mg Tbec (Aspirin) .... Once daily 5)  Metformin Hcl 500 Mg Tabs (Metformin hcl) .... Two times a day 6)  Multivitamin  .... One by mouth daily 7)  Folic Acid 1 Mg Tabs (Folic acid) .... One by mouth daily 8)  Ferrous Sulfate 325 (65 Fe) Mg Tabs (Ferrous sulfate) .... One by mouth twice daily 9)  Toprol Xl 100 Mg Tb24 (Metoprolol succinate) .... One daily 10)  Caltrate 600+d Plus 600-400 Mg-unit Tabs (Calcium carbonate-vit d-min) .... One two times a day 11)  Dm Shoes  .... Dx. niddm with ra and left little toe pre-ulcerative callous 12)  Accu-chek Aviva Strp (Glucose blood) .... Check fsbs once daily fasting 13)  Prednisone 5 Mg Tabs (Prednisone) .... One a day per dr. Dareen Piano of rheumatology 14)  Humira 40 Mg/0.56ml Kit (Adalimumab) .... One injection every other week per rheumatology  Patient Instructions: 1)  Please schedule a follow-up appointment in 3 months for a physical with pap & breast exam. 2)  Continue your current medications at this time. 3)  Schedule your yearly eye exam.  4)  It is important that you exercise regularly at least 20 minutes 5 times a week. If you develop chest pain, have severe difficulty breathing, or feel very tired , stop exercising immediately and seek medical attention. 5)  You need to lose weight. Consider a lower calorie diet and regular exercise.  Prescriptions: TOPROL XL 100 MG TB24 (METOPROLOL SUCCINATE) One daily  #30 Each x 3   Entered and Authorized by:   Esperanza Sheets PA   Signed by:   Esperanza Sheets PA on 09/23/2009   Method used:   Electronically to        [facility]* (retail)       [address] [place], [place]  [address]        Ph: [phone]  Fax: [phone]   RxID:   [id number] ACCU-CHEK AVIVA  STRP (GLUCOSE BLOOD) Check FSBS once daily fasting  #100 x 3   Entered and Authorized by:   Esperanza Sheets PA   Signed by:   Esperanza Sheets PA on 09/23/2009   Method used:   Electronically to        [facility]* (retail)       [address]       [place], [place]  [address]       Ph: [phone]       Fax: [phone]   RxID:   [id number] METFORMIN HCL 500 MG TABS (METFORMIN HCL) two times a day  #60 Each x 3   Entered and Authorized by:   Esperanza Sheets PA   Signed by:   Esperanza Sheets PA on 09/23/2009   Method used:   Electronically to        [facility]* (retail)       [address]       [place], [place]  [address]       Ph: [phone]       Fax: [phone]   RxID:   [id number] EXFORGE 10-320 MG TABS (AMLODIPINE BESYLATE-VALSARTAN) One by mouth daily  #30 Each x 3   Entered and Authorized by:   Esperanza Sheets PA   Signed by:   Esperanza Sheets PA on 09/23/2009   Method used:   Electronically to        [facility] (retail)       [address]       [place], [place]  [address]       Ph: [phone]       Fax: [phone]   RxID:   [id number]

## 2010-10-12 ENCOUNTER — Other Ambulatory Visit: Payer: Self-pay | Admitting: Family Medicine

## 2010-10-20 ENCOUNTER — Other Ambulatory Visit: Payer: Self-pay | Admitting: Family Medicine

## 2010-10-28 ENCOUNTER — Other Ambulatory Visit: Payer: Self-pay | Admitting: Family Medicine

## 2010-11-12 ENCOUNTER — Other Ambulatory Visit: Payer: Self-pay | Admitting: Family Medicine

## 2010-11-12 DIAGNOSIS — E119 Type 2 diabetes mellitus without complications: Secondary | ICD-10-CM

## 2010-11-28 ENCOUNTER — Encounter: Payer: Self-pay | Admitting: Physician Assistant

## 2010-11-29 ENCOUNTER — Encounter: Payer: Self-pay | Admitting: Family Medicine

## 2010-11-29 ENCOUNTER — Ambulatory Visit (INDEPENDENT_AMBULATORY_CARE_PROVIDER_SITE_OTHER): Payer: MEDICARE | Admitting: Family Medicine

## 2010-11-29 VITALS — BP 170/100 | HR 103 | Resp 16 | Ht 62.5 in | Wt 156.1 lb

## 2010-11-29 DIAGNOSIS — E119 Type 2 diabetes mellitus without complications: Secondary | ICD-10-CM

## 2010-11-29 DIAGNOSIS — E785 Hyperlipidemia, unspecified: Secondary | ICD-10-CM

## 2010-11-29 DIAGNOSIS — D509 Iron deficiency anemia, unspecified: Secondary | ICD-10-CM

## 2010-11-29 DIAGNOSIS — Z23 Encounter for immunization: Secondary | ICD-10-CM

## 2010-11-29 DIAGNOSIS — I10 Essential (primary) hypertension: Secondary | ICD-10-CM

## 2010-11-29 NOTE — Patient Instructions (Addendum)
F/u in 3 months.  Labs today.  tdap today.  You need zostavax also pls think about this.'  meds will be refilled for  4 months  Fasting labs in 3 months  pls sched your eye exam and  mammogram

## 2010-11-30 ENCOUNTER — Other Ambulatory Visit: Payer: Self-pay | Admitting: Family Medicine

## 2010-11-30 LAB — COMPLETE METABOLIC PANEL WITH GFR
ALT: 44 U/L — ABNORMAL HIGH (ref 0–35)
AST: 27 U/L (ref 0–37)
BUN: 11 mg/dL (ref 6–23)
Calcium: 10.3 mg/dL (ref 8.4–10.5)
Chloride: 98 mEq/L (ref 96–112)
Creat: 0.71 mg/dL (ref 0.40–1.20)
Total Bilirubin: 0.3 mg/dL (ref 0.3–1.2)

## 2010-11-30 LAB — HEMOGLOBIN A1C
Hgb A1c MFr Bld: 9.1 % — ABNORMAL HIGH (ref <5.7)
Mean Plasma Glucose: 214 mg/dL — ABNORMAL HIGH (ref <117)

## 2010-11-30 MED ORDER — METOPROLOL SUCCINATE ER 100 MG PO TB24: 100.0000 mg | ORAL_TABLET | Freq: Every day | ORAL | Status: DC

## 2010-11-30 MED ORDER — GLIMEPIRIDE 2 MG PO TABS: 2.0000 mg | ORAL_TABLET | Freq: Every day | ORAL | Status: DC

## 2010-11-30 MED ORDER — GLUCOSE BLOOD VI STRP: ORAL_STRIP | Status: DC

## 2010-11-30 MED ORDER — AMLODIPINE BESYLATE-VALSARTAN 10-320 MG PO TABS: 1.0000 | ORAL_TABLET | Freq: Every day | ORAL | Status: DC

## 2010-11-30 NOTE — Progress Notes (Signed)
Addended by: Adella Hare on: 11/30/2010 08:34 AM   Modules accepted: Orders, Medications

## 2010-11-30 NOTE — Assessment & Plan Note (Signed)
Unknown level of control, pt brings in a 6 day diary of fasting sugars which avg around 140, will wait on lab result

## 2010-11-30 NOTE — Progress Notes (Signed)
  Subjective:    Patient ID: Stephanie Mays, female    DOB: [birth year], 60 y.o.   MRN: [id number]  HPI The PT is here for follow up and re-evaluation of chronic medical conditions, medication management and review of recent lab and radiology data. Unfortunately current labs are out dated Preventive health is updated, specifically  Cancer screening, and Immunization.    The PT denies any adverse reactions to current medications since the last visit.  There are no new concerns.  There are no specific complaints       Review of Systems Denies recent fever or chills. Denies sinus pressure, nasal congestion, ear pain or sore throat. Denies chest congestion, productive cough or wheezing. Denies chest pains, palpitations, paroxysmal nocturnal dyspnea, orthopnea and leg swelling Denies abdominal pain, nausea, vomiting,diarrhea or constipation.  Denies rectal bleeding or change in bowel movement. Denies dysuria, frequency, hesitancy or incontinence. Denies joint pain, swelling and limitation in mobility. Denies headaches, seizure, numbness, or tingling. Denies depression, anxiety or insomnia. Denies skin break down or rash.         Objective:   Physical Exam    Patient alert and oriented and in no Cardiopulmonary distress.  HEENT: No facial asymmetry, EOMI, no sinus tenderness, TM's clear, Oropharynx pink and moist.  Neck supple no adenopathy.  Chest: Clear to auscultation bilaterally.  CVS: S1, S2 no murmurs, no S3.  ABD: Soft non tender. Bowel sounds normal.  Ext: No edema  MS: Adequate ROM spine, shoulders, hips and knees.  Skin: Intact, no ulcerations or rash noted.  Psych: Good eye contact, normal affect. Memory intact not anxious or depressed appearing.  CNS: CN 2-12 intact, power, tone and sensation normal throughout.     Assessment & Plan:

## 2010-11-30 NOTE — Assessment & Plan Note (Signed)
Uncontrolled, pt reports being out of meds x 3 days, will reassess in 6 weeks

## 2010-12-02 ENCOUNTER — Other Ambulatory Visit: Payer: Self-pay

## 2010-12-02 DIAGNOSIS — E119 Type 2 diabetes mellitus without complications: Secondary | ICD-10-CM

## 2010-12-02 MED ORDER — GLIPIZIDE 5 MG PO TABS: 5.0000 mg | ORAL_TABLET | Freq: Two times a day (BID) | ORAL | Status: DC

## 2010-12-02 NOTE — Progress Notes (Signed)
Patient aware. New med sent in

## 2010-12-06 NOTE — Procedures (Signed)
Stephanie Mays, Stephanie Mays                 ACCOUNT NO.:  [id number]   MEDICAL RECORD NO.:  [id number]          PATIENT TYPE:  OUT   LOCATION:  RESP                          FACILITY:  [facility]   PHYSICIAN:  Edward L. Juanetta Gosling, M.D.DATE OF BIRTH:  11/02/1950   DATE OF PROCEDURE:  08/22/2007  DATE OF DISCHARGE:                            PULMONARY FUNCTION TEST   1. Spirometry shows no ventilatory defect and no evidence of airflow      obstruction.  2. Lung volumes are normal.  3. DLCO is mildly reduced.  4. Arterial blood gases are normal.  5. There is no definite restrictive change on this study.      Edward L. Juanetta Gosling, M.D.  Electronically Signed     [facility]/MEDQ  D:  08/23/2007  T:  08/23/2007  Job:  [id number]   cc:   Franchot Heidelberg, M.D.

## 2010-12-09 NOTE — Procedures (Signed)
Stephanie Mays, Stephanie Mays                 ACCOUNT NO.:  [id number]   MEDICAL RECORD NO.:  [id number]          PATIENT TYPE:  OUT   LOCATION:  RAD                           FACILITY:  [facility]   PHYSICIAN:  Richard A. Alanda Amass, M.D.DATE OF BIRTH:  [birth year]   DATE OF PROCEDURE:  05/24/2006  DATE OF DISCHARGE:                                  ECHOCARDIOGRAM   A 2-D echo done in this 60 year old woman with a history of hypertension and  abnormal EKG.   Echo was technically adequate.     The aorta is normal at 2.8 cm.   The aortic valve has three leaflets and opens normally.  There is no aortic  stenosis and no AI seen.   Left atrium is normal 3.9 cm.  The patient was in sinus rhythm with IVCD on  EKG.  There were no left atrial clots seen.   IVS and LVDW show asymmetric thickening of a moderately severe degree.  Septum is thickened to 1.6 cm, the posterior wall to 1.4 cm.  There was no  outflow obstruction.  There was normal contraction pattern of the septum and  LVDW.   Mitral valve opens normally.  There was very mild mitral annular  calcification and trace mitral regurgitation present.   Tricuspid valve was normal.  There was mild tricuspid regurgitation.   There was trace pulmonic regurgitation.   Left ventricular internal dimensions to WNL.  LVIDD equals 3.4 cm, LVISD  equal 2.4 cm.  There was normal thickening of all visualized segments with  vigorous LV contraction and EF greater than 60%.  No segmental wall motion  abnormality seen.   Left ventricular inflow signal shows E to A reversal compatible with  diastolic relaxation abnormality.   Right ventricle is normal.   There was no pericardial effusion.   A 2-D echo demonstrates asymmetric septal thickening with moderate LVH and  no outflow tract obstruction.  No significant valvular disease noted.  Normal systolic function with vigorous LV contraction and evidence for  diastolic relaxation abnormality compatible with a  grade 1 diastolic  dysfunction.      Richard A. Alanda Amass, M.D.  Electronically Signed     RAW/MEDQ  D:  05/24/2006  T:  05/24/2006  Job:  [id number]   cc:   Erby Pian, M.D.

## 2010-12-09 NOTE — Op Note (Signed)
Stephanie Mays, Stephanie Mays                 ACCOUNT NO.:  [id number]   MEDICAL RECORD NO.:  [id number]          PATIENT TYPE:  AMB   LOCATION:  DAY                           FACILITY:  [facility]   PHYSICIAN:  Lionel December, M.D.    DATE OF BIRTH:  09-24-1950   DATE OF PROCEDURE:  09/10/2006  DATE OF DISCHARGE:                               OPERATIVE REPORT   PROCEDURE:  Colonoscopy with polypectomy.   INDICATION:  Stephanie Mays is 59 year old [place] American female who was recently  noted to have mild drop in her H&H with low iron and iron saturation but  normal ferritin and she also has heme-positive stools.  She is on low-  dose Stephanie but does not have any symptoms of upper GI tract.  She is on  low-dose Stephanie but does not have any upper GI symptoms.  She is undergoing  diagnostic colonoscopy.  If colon is clean, she will undergo EGD,  otherwise not.   FAMILY HISTORY:  History is significant for multiple malignancies  including colon carcinoma in her brother who died at age 56.  The  patient has never undergone colonoscopy before.  Procedure risks were  reviewed with the patient, informed consent was obtained.   MEDS FOR CONSCIOUS SEDATION:  Demerol 50 mg IV, Versed 10 mg IV in  divided dose.   FINDINGS:  Procedure performed in endoscopy suite.  The patient's vital  signs and O2 sat were monitored during procedure and remained stable.  The patient was placed left lateral position and rectal examination  performed.  No abnormality noted on external or digital exam.  Pentax  videoscope was placed rectum and advanced under vision into sigmoid  colon beyond.  There was fresh blood in the sigmoid colon small in  amount, which led to this. Advanced under vision into sigmoid colon  where there was some fresh blood which was followed to a large  pedunculated polyp with hemorrhagic surface.  It was just minimal ooze.  Scope was passed beyond this point into the proximal sigmoid colon and  beyond.  Preparation  was excellent.  Redundant hepatic flexure but I was  able to advance the scope to cecum which was identified by appendiceal  orifice and ileocecal valve.  Pictures taken for the record.  As the  scope was withdrawn, colonic mucosa was carefully examined.  There was  only one large polyp in the midsigmoid colon.  This polyp was snared.  Polyp was retrieved using Dormia basket pin.  There was minimal ooze of  fresh blood at polypectomy site.  It was coagulated using snare tip.  It  was further reinforced by applying one hemoclip to the stalk.  Hemostasis is felt to be complete.  Mucosa rest of the sigmoid colon  rectum was normal.  Scope was retroflexed to examine anorectal junction  and two small anal papilla were noted.  Endoscope was straightened and  withdrawn.  The patient tolerated the procedure well.   FINAL DIAGNOSIS:  A large (25 mm) pedunculated polyp with stigmata of  bleeding snared from sigmoid colon.  No other abnormality  noted.  Please  note some fresh blood noted at polypectomy site or stalk which easily  controlled with coagulation using snare tip and application of one  hemoclip around stalk.   Small anal papilla.   EGD was not performed.   RECOMMENDATIONS:  She will resume her Stephanie in 10 days.   I will be contacting the patient with results of biopsy and further  recommendations.      Lionel December, M.D.  Electronically Signed     NR/MEDQ  D:  09/10/2006  T:  09/10/2006  Job:  [id number]   cc:   Franchot Heidelberg, M.D.   Areatha Keas, M.D.  Fax: [phone]

## 2010-12-09 NOTE — Consult Note (Signed)
NAMEILLENE, Stephanie Mays                 ACCOUNT NO.:  [id number]   MEDICAL RECORD NO.:  [id number]          PATIENT TYPE:  AMB   LOCATION:  DAY                           FACILITY:  [facility]   PHYSICIAN:  Lionel December, M.D.    DATE OF BIRTH:  02-03-1951   DATE OF CONSULTATION:  08/24/2006  DATE OF DISCHARGE:                                 CONSULTATION   REASON FOR CONSULTATION:  Iron deficiency anemia, need for colonoscopy.   HISTORY OF PRESENT ILLNESS:  Stephanie Mays is a 60 year old African-American  female who presents for further evaluation of the above-stated findings.  She was started on methotrexate about 3 months ago for rheumatoid  arthritis.  Since that time she has developed mild anemia with a  hemoglobin of 11.6, although her hematocrit is inappropriately normal at  36.7.  Her iron, however, is low at 36, saturation is low at 11%, TIBC  is normal at 314, ferritin is elevated at 310, although likely acute  phase reactant.  The B12 and folate both normal.  Platelet count  slightly elevated at 433,000, white count normal at 6000.  She has never  had a colonoscopy.  She had a brother who succumbed to colon cancer at  age 31.  She denies any nausea, vomiting, anorexia, weight loss,  abdominal pain, heartburn, constipation, diarrhea, melena or rectal  bleeding.   CURRENT MEDICATION:  1. Metoprolol 50 mg daily.  2. Metformin 500 mg b.i.d.  3. Methotrexate 25 mg weekly.  4. Aspirin 81 mg daily.  5. Exforge 10/320 mg daily.  6. Hydrocodone 5/500 mg p.r.n.  7. Folic acid 1 mg daily.  8. Multivitamin daily.   ALLERGIES:  NO KNOWN DRUG ALLERGIES.   PAST MEDICAL HISTORY:  1. Diabetes mellitus.  2. Hypertension.  3. Rheumatoid arthritis.  4. Status post hysterectomy.   FAMILY HISTORY:  1. Mother succumbed to leukemia at age 43.  2. Father died with throat cancer at age 33.  3. She had a sister who died at age 82 with Hodgkin's lymphoma.  4. Brother died with colon cancer age 12.   SOCIAL HISTORY:  She is single with no children.  She is on disability.  She is a nonsmoker, no alcohol use.   REVIEW OF SYSTEMS:  See HPI for GI and CONSTITUTIONAL.  CARDIOPULMONARY:  No chest pain or shortness of breath.   PHYSICAL EXAMINATION:  Weight 142, height 5 feet 3 inches, temp 99.9,  blood pressure 140/84, pulse 92.  GENERAL:  A pleasant, well-nourished, well-developed black female in no  acute distress.  Skin warm and dry, no jaundice.  HEENT:  Sclera nonicteric. Oropharyngeal mucosa moist and pink.  No  lesions, erythema or exudates.  She has dentures, no lymphadenopathy.  CHEST:  Lungs are clear to auscultation.  CARDIAC:  Regular rate and rhythm.  Normal S1, S2.  No murmurs, rubs or  gallops.  ABDOMEN:  Positive bowel sounds.  Abdomen soft, nontender, nondistended.  No organomegaly or masses, no rebound tenderness or guarding, no  abdominal bruits or hernias.  EXTREMITIES:  No edema.   IMPRESSION:  Stephanie Mays is a 39 year old lady who recently had a drop in  her hemoglobin, iron and iron saturations.  Suspect this could be anemia  of chronic disease versus iron deficiency anemia.  Her ferritin is  elevated most likely due to acute phase reactant.  She has a significant  family history of colorectal cancer, therefore would highly recommend  colonoscopy at this time.  In addition, she has been on Naproxen but  discontinued a couple of weeks ago.  She also is on aspirin 81 mg daily.  I have asked her to complete 3 hemoccults and if any of these are  positive and are unexplained by her colonoscopy she will need to have an  EGD as well.   PLAN:  1. Colonoscopy in near future.  2. Hemoccult stool x3.  She will return these as soon as possible so      that we can determine whether or not she is going to need to have      an EGD as well.   I would like to thank Dr. Erby Pian for allowing [place] to take part in the  care of this patient.      Stephanie Mays, P.A.       Lionel December, M.D.  Electronically Signed    LL/MEDQ  D:  08/24/2006  T:  08/24/2006  Job:  [id number]   cc:   Franchot Heidelberg, M.D.

## 2011-02-07 ENCOUNTER — Other Ambulatory Visit: Payer: Self-pay | Admitting: Family Medicine

## 2011-03-02 LAB — MICROALBUMIN / CREATININE URINE RATIO
Microalb Creat Ratio: 12.4 mg/g (ref 0.0–30.0)
Microalb, Ur: 0.56 mg/dL (ref 0.00–1.89)

## 2011-03-03 ENCOUNTER — Other Ambulatory Visit: Payer: Self-pay | Admitting: Family Medicine

## 2011-03-03 DIAGNOSIS — Z139 Encounter for screening, unspecified: Secondary | ICD-10-CM

## 2011-03-06 ENCOUNTER — Encounter: Payer: Self-pay | Admitting: Family Medicine

## 2011-03-06 ENCOUNTER — Encounter: Payer: Self-pay | Admitting: *Deleted

## 2011-03-06 ENCOUNTER — Ambulatory Visit (INDEPENDENT_AMBULATORY_CARE_PROVIDER_SITE_OTHER): Payer: Medicare Other | Admitting: Family Medicine

## 2011-03-06 VITALS — BP 150/94 | HR 107 | Ht 62.5 in | Wt 153.0 lb

## 2011-03-06 DIAGNOSIS — R5383 Other fatigue: Secondary | ICD-10-CM

## 2011-03-06 DIAGNOSIS — M899 Disorder of bone, unspecified: Secondary | ICD-10-CM

## 2011-03-06 DIAGNOSIS — R5381 Other malaise: Secondary | ICD-10-CM

## 2011-03-06 DIAGNOSIS — D509 Iron deficiency anemia, unspecified: Secondary | ICD-10-CM

## 2011-03-06 DIAGNOSIS — Z2911 Encounter for prophylactic immunotherapy for respiratory syncytial virus (RSV): Secondary | ICD-10-CM

## 2011-03-06 DIAGNOSIS — Z13220 Encounter for screening for lipoid disorders: Secondary | ICD-10-CM

## 2011-03-06 DIAGNOSIS — I10 Essential (primary) hypertension: Secondary | ICD-10-CM

## 2011-03-06 DIAGNOSIS — M8580 Other specified disorders of bone density and structure, unspecified site: Secondary | ICD-10-CM

## 2011-03-06 DIAGNOSIS — Z23 Encounter for immunization: Secondary | ICD-10-CM

## 2011-03-06 DIAGNOSIS — M069 Rheumatoid arthritis, unspecified: Secondary | ICD-10-CM

## 2011-03-06 MED ORDER — HYDROCHLOROTHIAZIDE 25 MG PO TABS: 25.0000 mg | ORAL_TABLET | Freq: Every day | ORAL | Status: DC

## 2011-03-06 NOTE — Progress Notes (Signed)
  Subjective:    Patient ID: Stephanie Mays, female    DOB: 06-07-1951, 60 y.o.   MRN: [id number]  HPI HYPERTENSION  Disease Monitoring  Blood pressure range-unknown Chest pain- no      Dyspnea- no  Compliance- good Lightheadedness- no   Edema- no  DIABETES  Disease Monitoring  Blood Sugar ranges-range 98 to 117, daily testing Polyuria- no New Visual problems- no  Compliance- good Hypoglycemic symptoms- no  Last eye exam: 02/2010      Podiatry visit: no  HYPERLIPIDEMIA  Compliance- n/a flax seed RUQ pain- no  Muscle aches- no C/o being told she had and hernia, states she notes it when lying down,denies pain    REGULAR EXERCISE-occasional, limited by arthritis      Review of Systems See HPI Denies recent fever or chills. Denies sinus pressure, nasal congestion, ear pain or sore throat. Denies chest congestion, productive cough or wheezing. Denies chest pains, palpitations and leg swelling Denies abdominal pain, nausea, vomiting,diarrhea or constipation.   Denies dysuria, frequency, hesitancy or incontinence. Denies headaches, seizures, numbness, or tingling. Denies depression, anxiety or insomnia. Denies skin break down or rash.        Objective:   Physical Exam Patient alert and oriented and in no cardiopulmonary distress.  HEENT: No facial asymmetry, EOMI, no sinus tenderness,  oropharynx pink and moist.  Neck supple no adenopathy.  Chest: Clear to auscultation bilaterally.  CVS: S1, S2 no murmurs, no S3.  ABD: Soft non tender. Bowel sounds normal.  Ext: No edema  MS: Adequate ROM spine, shoulders, hips and knees.Swelling and deformity of joints in hands  Skin: Intact, no ulcerations or rash noted.  Psych: Good eye contact, normal affect. Memory intact not anxious or depressed appearing.  CNS: CN 2-12 intact, power, tone and sensation normal throughout.  Diabetic Foot Check:  Appearance - no lesions, ulcers or calluses Skin - no unusual pallor  or redness Sensation - grossly intact to light touch Monofilament testing -  Right - Great toe, medial, central, lateral ball and posterior foot intact Left - Great toe, medial, central, lateral ball and posterior foot intact Pulses Left - Dorsalis Pedis and Posterior Tibia normal Right - Dorsalis Pedis and Posterior Tibia normal       Assessment & Plan:

## 2011-03-06 NOTE — Patient Instructions (Addendum)
cPE October 7 or after.  HBA1C and Chem 7 and EGFR today.  Fasting lipid, chem 7 TSH, vit  D cbc  In October 3 to 5 days before visit  Zostavax today.   Blood pressure is high, new additional medication to be started, take in the morning pls , hCTZ, continue all other meds  Pls make eye appt.  Pls look into water aerobics.  Weight loss is recommended for improved health, aim  To lose 2 to 3  Pounds per month... PORTION control is important  You are referred for an echocardiogram to evaluate your heart structure and function

## 2011-03-07 ENCOUNTER — Ambulatory Visit (HOSPITAL_COMMUNITY)
Admission: RE | Admit: 2011-03-07 | Discharge: 2011-03-07 | Disposition: A | Discharge status: Home / Self Care | Payer: Medicare Other | Source: Ambulatory Visit | Attending: Family Medicine | Admitting: Family Medicine

## 2011-03-07 DIAGNOSIS — Z139 Encounter for screening, unspecified: Secondary | ICD-10-CM

## 2011-03-07 DIAGNOSIS — Z1231 Encounter for screening mammogram for malignant neoplasm of breast: Secondary | ICD-10-CM | POA: Insufficient documentation

## 2011-03-07 LAB — COMPLETE METABOLIC PANEL WITH GFR
ALT: 42 U/L — ABNORMAL HIGH (ref 0–35)
AST: 30 U/L (ref 0–37)
Albumin: 4.2 g/dL (ref 3.5–5.2)
Calcium: 10.1 mg/dL (ref 8.4–10.5)
Chloride: 100 mEq/L (ref 96–112)
Potassium: 4.8 mEq/L (ref 3.5–5.3)

## 2011-03-07 LAB — HEMOGLOBIN A1C
Hgb A1c MFr Bld: 9.3 % — ABNORMAL HIGH (ref <5.7)
Mean Plasma Glucose: 220 mg/dL — ABNORMAL HIGH (ref <117)

## 2011-03-07 MED ORDER — GLIPIZIDE 10 MG PO TABS: 10.0000 mg | ORAL_TABLET | Freq: Two times a day (BID) | ORAL | Status: DC

## 2011-03-07 NOTE — Assessment & Plan Note (Signed)
Uncontrolled, pt to attend class and dose increase on glipizide, she is to test once daily and call if values remain out of range

## 2011-03-07 NOTE — Assessment & Plan Note (Addendum)
Medication compliance addressed. Commitment to regular exercise and healthy  food choices, with portion control discussed. DASH diet and low fat diet discussed and literature offered. Changes in medication made at this visit.  

## 2011-03-07 NOTE — Assessment & Plan Note (Signed)
Followed by rheumatologist, on daily prednisone and methotrexate. Generalized joint pains

## 2011-03-09 ENCOUNTER — Other Ambulatory Visit: Payer: Self-pay | Admitting: Family Medicine

## 2011-03-09 ENCOUNTER — Ambulatory Visit (HOSPITAL_COMMUNITY)
Admission: RE | Admit: 2011-03-09 | Discharge: 2011-03-09 | Disposition: A | Discharge status: Home / Self Care | Payer: Medicare Other | Source: Ambulatory Visit | Attending: Family Medicine | Admitting: Family Medicine

## 2011-03-09 DIAGNOSIS — I10 Essential (primary) hypertension: Secondary | ICD-10-CM | POA: Insufficient documentation

## 2011-03-09 DIAGNOSIS — R011 Cardiac murmur, unspecified: Secondary | ICD-10-CM | POA: Insufficient documentation

## 2011-03-09 DIAGNOSIS — E119 Type 2 diabetes mellitus without complications: Secondary | ICD-10-CM | POA: Insufficient documentation

## 2011-03-09 DIAGNOSIS — I517 Cardiomegaly: Secondary | ICD-10-CM

## 2011-03-09 NOTE — Progress Notes (Signed)
*  PRELIMINARY RESULTS* Echocardiogram 2D Echocardiogram has been performed.  Conrad [place] 03/09/2011, 10:29 AM

## 2011-03-24 NOTE — Telephone Encounter (Signed)
Ok to fill 

## 2011-03-28 NOTE — Telephone Encounter (Signed)
pls do not refill prednisone

## 2011-03-31 ENCOUNTER — Other Ambulatory Visit: Payer: Self-pay | Admitting: Family Medicine

## 2011-04-13 LAB — BLOOD GAS, ARTERIAL
Acid-Base Excess: 0.3
Bicarbonate: 24.1 — ABNORMAL HIGH
O2 Saturation: 96.1
Patient temperature: 37
pCO2 arterial: 36.7
pH, Arterial: 7.433 — ABNORMAL HIGH
pO2, Arterial: 88

## 2011-05-03 LAB — CBC WITH DIFFERENTIAL/PLATELET
Basophils Absolute: 0 10*3/uL (ref 0.0–0.1)
Basophils Relative: 0 % (ref 0–1)
Eosinophils Absolute: 0.1 10*3/uL (ref 0.0–0.7)
Eosinophils Relative: 1 % (ref 0–5)
Lymphs Abs: 2.1 10*3/uL (ref 0.7–4.0)
MCH: 25.8 pg — ABNORMAL LOW (ref 26.0–34.0)
MCHC: 29 g/dL — ABNORMAL LOW (ref 30.0–36.0)
Neutrophils Relative %: 62 % (ref 43–77)
Platelets: 408 10*3/uL — ABNORMAL HIGH (ref 150–400)
RBC: 3.99 MIL/uL (ref 3.87–5.11)
RDW: 17.1 % — ABNORMAL HIGH (ref 11.5–15.5)

## 2011-05-03 LAB — LIPID PANEL
LDL Cholesterol: 74 mg/dL (ref 0–99)
Total CHOL/HDL Ratio: 2.8 Ratio
Triglycerides: 127 mg/dL (ref <150)
VLDL: 25 mg/dL (ref 0–40)

## 2011-05-03 LAB — BASIC METABOLIC PANEL
Chloride: 100 mEq/L (ref 96–112)
Creat: 0.66 mg/dL (ref 0.50–1.10)
Potassium: 4 mEq/L (ref 3.5–5.3)

## 2011-05-03 LAB — TSH: TSH: 1.653 u[IU]/mL (ref 0.350–4.500)

## 2011-05-04 ENCOUNTER — Encounter: Payer: Self-pay | Admitting: Family Medicine

## 2011-05-06 LAB — VITAMIN D 1,25 DIHYDROXY
Vitamin D 1, 25 (OH)2 Total: 57 pg/mL (ref 18–72)
Vitamin D3 1, 25 (OH)2: 57 pg/mL

## 2011-05-08 ENCOUNTER — Encounter: Payer: Medicare Other | Admitting: Family Medicine

## 2011-05-09 ENCOUNTER — Other Ambulatory Visit: Payer: Self-pay | Admitting: Family Medicine

## 2011-05-09 ENCOUNTER — Ambulatory Visit (INDEPENDENT_AMBULATORY_CARE_PROVIDER_SITE_OTHER): Payer: Medicare Other | Admitting: Family Medicine

## 2011-05-09 ENCOUNTER — Encounter: Payer: Self-pay | Admitting: Family Medicine

## 2011-05-09 VITALS — BP 160/100 | HR 116 | Resp 16 | Ht 62.5 in | Wt 153.1 lb

## 2011-05-09 DIAGNOSIS — R5383 Other fatigue: Secondary | ICD-10-CM

## 2011-05-09 DIAGNOSIS — I10 Essential (primary) hypertension: Secondary | ICD-10-CM

## 2011-05-09 DIAGNOSIS — IMO0001 Reserved for inherently not codable concepts without codable children: Secondary | ICD-10-CM

## 2011-05-09 DIAGNOSIS — D509 Iron deficiency anemia, unspecified: Secondary | ICD-10-CM

## 2011-05-09 DIAGNOSIS — R5381 Other malaise: Secondary | ICD-10-CM

## 2011-05-09 DIAGNOSIS — R7301 Impaired fasting glucose: Secondary | ICD-10-CM

## 2011-05-09 DIAGNOSIS — Z1211 Encounter for screening for malignant neoplasm of colon: Secondary | ICD-10-CM

## 2011-05-09 DIAGNOSIS — R1900 Intra-abdominal and pelvic swelling, mass and lump, unspecified site: Secondary | ICD-10-CM

## 2011-05-09 DIAGNOSIS — Z23 Encounter for immunization: Secondary | ICD-10-CM

## 2011-05-09 LAB — HEMOCCULT GUIAC POC 1CARD (OFFICE)

## 2011-05-09 NOTE — Patient Instructions (Addendum)
F/u early December.  Cbc, chem 7  And HBA1C 3 to 5 days before vist , and anemia panel.  Start iron tablet one daily.  You are anemic and are losing blood in your stool, I will refer  you to Dr Maren Reamer are referred for a scan of your abdomen  Flu vaccine today  Blood pressure is high, please take the HCTZ one daily as prescribed

## 2011-05-11 ENCOUNTER — Encounter (INDEPENDENT_AMBULATORY_CARE_PROVIDER_SITE_OTHER): Payer: Self-pay | Admitting: *Deleted

## 2011-05-14 ENCOUNTER — Encounter: Payer: Self-pay | Admitting: Family Medicine

## 2011-05-14 ENCOUNTER — Other Ambulatory Visit: Payer: Self-pay | Admitting: Family Medicine

## 2011-05-14 NOTE — Assessment & Plan Note (Signed)
inframbilical mass palpated which is non reducible, pt referred for abd scan

## 2011-05-14 NOTE — Assessment & Plan Note (Signed)
Significant fall in HB in the past 7 months with heme positive stool , referred to GI

## 2011-05-14 NOTE — Progress Notes (Signed)
  Subjective:    Patient ID: Stephanie Mays, female    DOB: [birth year], 60 y.o.   MRN: [id number]  HPI The PT is here for annual exam and re-evaluation of chronic medical conditions, medication management and review of any available recent lab and radiology data.  Preventive health is updated, specifically  Cancer screening and Immunization.   Questions or concerns regarding consultations or procedures which the PT has had in the interim are  addressed. The PT denies any adverse reactions to current medications since the last visit.  There are no new concerns.  There are no specific complaints       Review of Systems See HPI Denies recent fever or chills. Denies sinus pressure, nasal congestion, ear pain or sore throat. Denies chest congestion, productive cough or wheezing. Denies chest pains, palpitations and leg swelling Denies abdominal pain, nausea, vomiting,diarrhea or constipation.   Denies dysuria, frequency, hesitancy or incontinence. Chronic joint deformity with limitation in mobility, from sever rheumatoid arthritis Denies headaches, seizures, numbness, or tingling. Denies depression, anxiety or insomnia. Denies skin break down or rash.        Objective:   Physical Exam Pleasant well nourished female, alert and oriented x 3, in no cardio-pulmonary distress. Afebrile. HEENT No facial trauma or asymetry. Sinuses non tender.  EOMI, PERTL, fundoscopic exam is normal, no hemorhage or exudate.  External ears normal, tympanic membranes clear. Oropharynx moist, no exudate Neck: supple, no adenopathy,JVD or thyromegaly.No bruits.  Chest: Clear to ascultation bilaterally.No crackles or wheezes. Non tender to palpation  Breast: No asymetry,no masses. No nipple discharge or inversion. No axillary or supraclavicular adenopathy  Cardiovascular system; Heart sounds normal,  S1 and  S2 ,no S3.  No murmur, or thrill. Apical beat not displaced Peripheral pulses  normal.  Abdomen: Soft, non tender, midline infraumbilical mass, not reducible.No bruits. Bowel sounds normal. No guarding, tenderness or rebound.  Rectal:  No mass. Guaiac positive stool.  GU: External genitalia normal. No lesions. Vaginal canal normal.No discharge. Uterus absent, no adnexal masses, no adnexal tenderness.  Musculoskeletal exam: Full ROM of spine, hips , shoulders and knees. Deformity ,of joints in the hands No muscle wasting or atrophy.   Neurologic: Cranial nerves 2 to 12 intact. Power, tone ,sensation and reflexes normal throughout. No disturbance in gait. No tremor.  Skin: Intact, no ulceration, erythema , scaling or rash noted. Pigmentation normal throughout  Psych; Normal mood and affect. Judgement and concentration normal       Assessment & Plan:

## 2011-05-14 NOTE — Assessment & Plan Note (Signed)
Medication compliance addressed. Commitment to regular exercise and healthy  food choices, with portion control discussed. DASH diet and low fat diet discussed and literature offered. Changes in medication made at this visit.  

## 2011-05-14 NOTE — Assessment & Plan Note (Signed)
Reports improvement in blood sugar values on current regime, will await next hBA1C to ascertain this. The importance of following a low carb diet, and regular physical activity with med compliance and regular testing is stressed

## 2011-05-17 ENCOUNTER — Ambulatory Visit (HOSPITAL_COMMUNITY)
Admission: RE | Admit: 2011-05-17 | Discharge: 2011-05-17 | Disposition: A | Discharge status: Home / Self Care | Payer: Medicare Other | Source: Ambulatory Visit | Attending: Family Medicine | Admitting: Family Medicine

## 2011-05-17 DIAGNOSIS — D509 Iron deficiency anemia, unspecified: Secondary | ICD-10-CM | POA: Insufficient documentation

## 2011-05-17 DIAGNOSIS — R1909 Other intra-abdominal and pelvic swelling, mass and lump: Secondary | ICD-10-CM | POA: Insufficient documentation

## 2011-05-17 DIAGNOSIS — R1900 Intra-abdominal and pelvic swelling, mass and lump, unspecified site: Secondary | ICD-10-CM

## 2011-05-17 DIAGNOSIS — K439 Ventral hernia without obstruction or gangrene: Secondary | ICD-10-CM | POA: Insufficient documentation

## 2011-05-17 MED ORDER — IOHEXOL 300 MG/ML  SOLN
100.0000 mL | Freq: Once | INTRAMUSCULAR | Status: AC | PRN
  Administered 2011-05-17: 100 mL via INTRAVENOUS

## 2011-05-25 ENCOUNTER — Encounter (INDEPENDENT_AMBULATORY_CARE_PROVIDER_SITE_OTHER): Payer: Self-pay | Admitting: Internal Medicine

## 2011-05-25 ENCOUNTER — Ambulatory Visit (INDEPENDENT_AMBULATORY_CARE_PROVIDER_SITE_OTHER): Payer: Medicare Other | Admitting: Internal Medicine

## 2011-05-25 VITALS — BP 126/80 | HR 80 | Temp 99.8°F | Ht 63.0 in | Wt 152.2 lb

## 2011-05-25 DIAGNOSIS — D126 Benign neoplasm of colon, unspecified: Secondary | ICD-10-CM

## 2011-05-25 DIAGNOSIS — D649 Anemia, unspecified: Secondary | ICD-10-CM

## 2011-05-25 DIAGNOSIS — K635 Polyp of colon: Secondary | ICD-10-CM

## 2011-05-25 NOTE — Patient Instructions (Signed)
Follow up pending colonoscopy. The risks and benefits such as perforation, bleeding, and infection were reviewed with the patient and is agreeable.

## 2011-05-25 NOTE — Progress Notes (Addendum)
Subjective:     Patient ID: Stephanie Mays, female   DOB: September 19, 1950, 60 y.o.   MRN: [id number]  HPI Referred by Dr. Lodema Hong for heme positive stool.  She has also had a drop in her Hemoglobin. Stephanie Mays denies rectal bleeding. Stools are brown in color. Stools are normal size. No melena. She says her stools are black when she takes iron. Appetite is good. No weight loss. She denies having any GI problems.  She takes an 81mg  ASA daily and takes Naproxen prn.   Her last colonoscopy was in 2008 for a slight drop in her hemoglobin and heme positive stools. FINAL DIAGNOSIS: A large (25 mm) pedunculated polyp with stigmata of  bleeding snared from sigmoid colon. No other abnormality noted. Please  note some fresh blood noted at polypectomy site or stalk which easily  controlled with coagulation using snare tip and application of one  hemoclip around stalk.  Small anal papilla.    Biopsy 2008 MICROSCOPIC EXAMINATION AND DIAGNOSIS  SIGMOID COLON, BIOPSY: TUBULAR ADENOMA. NO HIGH GRADE DYSPLASIA OR MALIGNANCY IDENTIFIED.   Ha and H 10.3 and 35.3, MC  89. Review of Systems  see hpi  Current Outpatient Prescriptions  Medication Sig Dispense Refill  . aspirin 81 MG tablet Take 81 mg by mouth daily.        . Calcium Citrate-Vitamin D (CITRACAL + D PO) Take 1 tablet by mouth daily.        . ferrous sulfate 325 (65 FE) MG tablet Take 325 mg by mouth 2 (two) times daily.        . Flaxseed, Linseed, (FLAX SEED OIL PO) Take 1 tablet by mouth daily.        . folic acid (FOLVITE) 1 MG tablet Take 1 mg by mouth daily.        Marland Kitchen glipiZIDE (GLUCOTROL) 10 MG tablet Take 1 tablet (10 mg total) by mouth 2 (two) times daily.  60 tablet  11  . glucose blood (ACCU-CHEK AVIVA PLUS) test strip Once daily testing  50 each  3  . hydrochlorothiazide 25 MG tablet Take 1 tablet (25 mg total) by mouth daily.  30 tablet  5  . HYDROcodone-acetaminophen (LORTAB) 7.5-500 MG per tablet Take 1 tablet by mouth 4 (four) times daily  as needed.        Marland Kitchen KOMBIGLYZE XR 2.11-998 MG TB24 TAKE 1 TABLET BY MOUTH   TWICE A DAY WITH FOOD FORDIABETES.  60 tablet  5  . methotrexate (RHEUMATREX) 2.5 MG tablet Take 2.5 mg by mouth once a week. 8 tabs once weekly       . Multiple Vitamin (MULTIVITAMIN) tablet Take 1 tablet by mouth daily.        . naproxen (NAPROSYN) 500 MG tablet Take 500 mg by mouth as needed.       . predniSONE (DELTASONE) 5 MG tablet TAKE 2 TABLETS BY MOUTH  ONCE DAILY WITH FOOD.  60 tablet  0  . TOPROL XL 100 MG 24 hr tablet TAKE ONE TABLET BY MOUTH ONCE DAILY.  30 each  3  . EXFORGE 10-320 MG per tablet TAKE 1 TABLET EVERY DAY.  30 each  3   Past Surgical History  Procedure Date  . Abdominal hysterectomy    Past Medical History  Diagnosis Date  . Anemia   . Diabetes mellitus   . Hypertension   . Hyperlipidemia   . Diabetic nephropathy   . Arthritis 1999    rheumatoid   Family  Status  Relation Status Death Age  . Mother Deceased     leukemia  . Father Deceased   . Sister Alive   . Brother Alive   . Sister Alive   . Sister Alive   . Sister Alive   . Sister Deceased   . Brother Alive   . Brother Deceased    History   Social History Narrative  . No narrative on file   History   Social History  . Marital Status: Single    Spouse Name: N/A    Number of Children: N/A  . Years of Education: N/A   Occupational History  . Not on file.   Social History Main Topics  . Smoking status: Never Smoker   . Smokeless tobacco: Not on file  . Alcohol Use: No  . Drug Use: No  . Sexually Active: Not on file   Other Topics Concern  . Not on file   Social History Narrative  . No narrative on file       Objective:   Physical Exam  Filed Vitals:   05/25/11 1530  BP: 126/80  Pulse: 80  Temp: 99.8 F (37.7 C)  Height: 5\' 3"  (1.6 m)  Weight: 152 lb 3.2 oz (69.037 kg)    Alert and oriented. Skin warm and dry. Oral mucosa is moist. Endentulous. Sclera anicteric, conjunctivae is pink.  Thyroid not enlarged. No cervical lymphadenopathy. Lungs clear. Heart regular rate and rhythm.  Abdomen is soft. Bowel sounds are positive. No hepatomegaly. No abdominal masses felt. No tenderness.  No edema to lower extremities. Patient is alert and oriented.      Assessment:    Anemia. Heme positive stools with a hx of of polyp with stigmata of bleeding on last colonoscopy.     Plan:    colonoscopy with Dr Karilyn Cota. The risks and benefits such as perforation, bleeding, and infection were reviewed with the patient and is agreeable.

## 2011-05-26 ENCOUNTER — Telehealth (INDEPENDENT_AMBULATORY_CARE_PROVIDER_SITE_OTHER): Payer: Self-pay | Admitting: *Deleted

## 2011-05-26 ENCOUNTER — Other Ambulatory Visit (INDEPENDENT_AMBULATORY_CARE_PROVIDER_SITE_OTHER): Payer: Self-pay | Admitting: *Deleted

## 2011-05-26 ENCOUNTER — Encounter (INDEPENDENT_AMBULATORY_CARE_PROVIDER_SITE_OTHER): Payer: Self-pay | Admitting: *Deleted

## 2011-05-26 DIAGNOSIS — R195 Other fecal abnormalities: Secondary | ICD-10-CM

## 2011-05-26 DIAGNOSIS — Z86010 Personal history of colonic polyps: Secondary | ICD-10-CM

## 2011-05-26 DIAGNOSIS — D649 Anemia, unspecified: Secondary | ICD-10-CM

## 2011-05-26 MED ORDER — PEG-KCL-NACL-NASULF-NA ASC-C 100 G PO SOLR: 1.0000 | Freq: Once | ORAL | Status: DC

## 2011-05-26 NOTE — Telephone Encounter (Signed)
Patient needs movi prep 

## 2011-06-03 ENCOUNTER — Other Ambulatory Visit: Payer: Self-pay | Admitting: Family Medicine

## 2011-06-19 ENCOUNTER — Encounter (HOSPITAL_COMMUNITY): Payer: Self-pay | Admitting: Pharmacy Technician

## 2011-06-26 MED ORDER — SODIUM CHLORIDE 0.45 % IV SOLN
Freq: Once | INTRAVENOUS | Status: AC
  Administered 2011-06-27: 08:00:00 via INTRAVENOUS

## 2011-06-27 ENCOUNTER — Encounter (HOSPITAL_COMMUNITY): Payer: Self-pay | Admitting: Internal Medicine

## 2011-06-27 ENCOUNTER — Encounter (HOSPITAL_COMMUNITY)
Admission: RE | Disposition: A | Discharge status: Home / Self Care | Payer: Self-pay | Source: Ambulatory Visit | Attending: Internal Medicine

## 2011-06-27 ENCOUNTER — Ambulatory Visit (HOSPITAL_COMMUNITY)
Admission: RE | Admit: 2011-06-27 | Discharge: 2011-06-27 | Disposition: A | Discharge status: Home / Self Care | Payer: Medicare Other | Source: Ambulatory Visit | Attending: Internal Medicine | Admitting: Internal Medicine

## 2011-06-27 ENCOUNTER — Other Ambulatory Visit (INDEPENDENT_AMBULATORY_CARE_PROVIDER_SITE_OTHER): Payer: Self-pay | Admitting: Internal Medicine

## 2011-06-27 DIAGNOSIS — E785 Hyperlipidemia, unspecified: Secondary | ICD-10-CM | POA: Insufficient documentation

## 2011-06-27 DIAGNOSIS — D128 Benign neoplasm of rectum: Secondary | ICD-10-CM | POA: Insufficient documentation

## 2011-06-27 DIAGNOSIS — K644 Residual hemorrhoidal skin tags: Secondary | ICD-10-CM | POA: Insufficient documentation

## 2011-06-27 DIAGNOSIS — Z860100 Personal history of colon polyps, unspecified: Secondary | ICD-10-CM | POA: Insufficient documentation

## 2011-06-27 DIAGNOSIS — D129 Benign neoplasm of anus and anal canal: Secondary | ICD-10-CM | POA: Insufficient documentation

## 2011-06-27 DIAGNOSIS — I10 Essential (primary) hypertension: Secondary | ICD-10-CM | POA: Insufficient documentation

## 2011-06-27 DIAGNOSIS — K921 Melena: Secondary | ICD-10-CM | POA: Insufficient documentation

## 2011-06-27 DIAGNOSIS — Z7982 Long term (current) use of aspirin: Secondary | ICD-10-CM | POA: Insufficient documentation

## 2011-06-27 DIAGNOSIS — E119 Type 2 diabetes mellitus without complications: Secondary | ICD-10-CM | POA: Insufficient documentation

## 2011-06-27 DIAGNOSIS — D649 Anemia, unspecified: Secondary | ICD-10-CM

## 2011-06-27 DIAGNOSIS — D126 Benign neoplasm of colon, unspecified: Secondary | ICD-10-CM

## 2011-06-27 DIAGNOSIS — Z01812 Encounter for preprocedural laboratory examination: Secondary | ICD-10-CM | POA: Insufficient documentation

## 2011-06-27 DIAGNOSIS — R195 Other fecal abnormalities: Secondary | ICD-10-CM

## 2011-06-27 DIAGNOSIS — Z79899 Other long term (current) drug therapy: Secondary | ICD-10-CM | POA: Insufficient documentation

## 2011-06-27 HISTORY — PX: COLONOSCOPY: SHX5424

## 2011-06-27 SURGERY — COLONOSCOPY
Anesthesia: Moderate Sedation

## 2011-06-27 MED ORDER — MEPERIDINE HCL 50 MG/ML IJ SOLN
INTRAMUSCULAR | Status: DC | PRN
  Administered 2011-06-27 (×2): 25 mg via INTRAVENOUS

## 2011-06-27 MED ORDER — MIDAZOLAM HCL 5 MG/5ML IJ SOLN
INTRAMUSCULAR | Status: DC | PRN
  Administered 2011-06-27: 2 mg via INTRAVENOUS
  Administered 2011-06-27: 1 mg via INTRAVENOUS
  Administered 2011-06-27: 2 mg via INTRAVENOUS
  Administered 2011-06-27 (×2): 1 mg via INTRAVENOUS

## 2011-06-27 MED ORDER — MIDAZOLAM HCL 5 MG/5ML IJ SOLN
INTRAMUSCULAR | Status: AC
  Filled 2011-06-27: qty 10

## 2011-06-27 MED ORDER — SODIUM CHLORIDE 0.9 % IJ SOLN
INTRAMUSCULAR | Status: AC
  Filled 2011-06-27: qty 30

## 2011-06-27 MED ORDER — STERILE WATER FOR IRRIGATION IR SOLN
Status: DC | PRN
  Administered 2011-06-27: 08:00:00

## 2011-06-27 MED ORDER — MEPERIDINE HCL 50 MG/ML IJ SOLN
INTRAMUSCULAR | Status: AC
  Filled 2011-06-27: qty 1

## 2011-06-27 NOTE — Op Note (Signed)
COLONOSCOPY PROCEDURE REPORT  PATIENT:  Stephanie Mays  MR#:  [id number] Birthdate:  1951-06-14, 60 y.o., female Endoscopist:  Dr. Malissa Hippo, MD Referred By:  Dr. Tobin Chad, Md Procedure Date: 06/27/2011  Procedure:   Colonoscopy with snare polypectomy.  Indications:  Patient is 60 year old African female with large tubular adenoma removed: 42,008. She was recently noted to have heme positive stool in slight drop in her H&H. She is undergoing colonoscopy for diagnostic and surveillance purposes.  Informed Consent:  Seizure and risks were reviewed with the patient and informed consent was obtained. Medications:  Demerol 50 mg IV Versed 7 mg IV  Description of procedure:  After a digital rectal exam was performed, that colonoscope was advanced from the anus through the rectum and colon to the area of the cecum, ileocecal valve and appendiceal orifice. The cecum was deeply intubated. These structures were well-seen and photographed for the record. From the level of the cecum and ileocecal valve, the scope was slowly and cautiously withdrawn. The mucosal surfaces were carefully surveyed utilizing scope tip to flexion to facilitate fold flattening as needed. The scope was pulled down into the rectum where a thorough exam including retroflexion was performed.  Findings:   Prep excellent. 6-7 mm polyp snared from ascending colon. Most of the polyp was coagulated during this process small sample was obtained for histology. 8-9 mm polyp snared from hepatic flexure. 3 cm broad-based polyp at rectum. Saline-assisted polypectomy performed. Polypectomy was complete. Polyp retrieved with a Roth net. Small anal papilla hemorrhoids below the dentate line.  Therapeutic/Diagnostic Maneuvers Performed:  See above  Complications:  None  Cecal Withdrawal Time:  40 minutes  Impression:  Examination performed to cecum. 6-7 mm polyp snared from ascending colon. 8-9 mm polyp snared from hepatic  flexure. 30 mm broad-based polyp snared from rectum following saline ejection.  Recommendations:  Standard instructions given. I be contacting patient with results of biopsy.  Contrell Ballentine U  06/27/2011 8:53 AM  CC: Dr. [name], MD, MD & Dr. Bonnetta Barry ref. provider found

## 2011-06-27 NOTE — H&P (Signed)
Stephanie Mays is an 60 y.o. female.   Chief Complaint: Patient is here for colonoscopy. HPI: Patient is 60 year old African American female who was recently noted to have heme-positive stools by Dr. [name]. She was also noted to have drop in hemoglobin. She denies melena, rectal bleeding, abdominal pain, nausea, vomiting, heartburn or weight loss. There is no history of peptic ulcer disease. She is on low-dose aspirin does not take any other OTC NSAIDs.Marland Kitchen Her last colonoscopy was in February 2008 with removal of large tubular adenoma. She believes one of her brothers died of colon carcinoma but no further details are available.  Past Medical History  Diagnosis Date  . Anemia   . Diabetes mellitus   . Hypertension   . Hyperlipidemia   . Diabetic nephropathy   . Arthritis 1999    rheumatoid  . RA (rheumatoid arthritis)     Past Surgical History  Procedure Date  . Abdominal hysterectomy     Family History  Problem Relation Age of Onset  . Cancer Father     throat  . Diabetes Father   . Hypertension Father   . Diabetes Sister   . Hypertension Sister   . Diabetes Sister   . Cancer Brother     colon   Social History:  reports that she has never smoked. She does not have any smokeless tobacco history on file. She reports that she does not drink alcohol or use illicit drugs.  Allergies: No Known Allergies  Medications Prior to Admission  Medication Dose Route Frequency Provider Last Rate Last Dose  . 0.45 % sodium chloride infusion   Intravenous Once Malissa Hippo, MD 20 mL/hr at 06/27/11 0730    . meperidine (DEMEROL) 50 MG/ML injection           . midazolam (VERSED) 5 MG/5ML injection            Medications Prior to Admission  Medication Sig Dispense Refill  . aspirin EC 81 MG tablet Take 81 mg by mouth daily.        . Calcium Citrate-Vitamin D (CITRACAL + D PO) Take 1 tablet by mouth daily.        Marland Kitchen EXFORGE 10-320 MG per tablet TAKE 1 TABLET EVERY DAY.  30 each   3  . ferrous sulfate 325 (65 FE) MG tablet Take 325 mg by mouth 2 (two) times daily.        . Flaxseed, Linseed, (FLAX SEED OIL PO) Take 1 tablet by mouth daily.        . folic acid (FOLVITE) 1 MG tablet Take 1 mg by mouth daily.        Marland Kitchen glipiZIDE (GLUCOTROL) 10 MG tablet Take 1 tablet (10 mg total) by mouth 2 (two) times daily.  60 tablet  11  . glucose blood (ACCU-CHEK AVIVA PLUS) test strip Once daily testing  50 each  3  . hydrochlorothiazide 25 MG tablet Take 1 tablet (25 mg total) by mouth daily.  30 tablet  5  . HYDROcodone-acetaminophen (LORTAB) 7.5-500 MG per tablet Take 1 tablet by mouth 4 (four) times daily as needed.        . methotrexate (RHEUMATREX) 2.5 MG tablet Take 2.5 mg by mouth once a week. 8 tabs once weekly. On Friday      . Multiple Vitamin (MULTIVITAMIN) tablet Take 1 tablet by mouth daily.        . naproxen (NAPROSYN) 500 MG tablet Take 500 mg by mouth as  needed.       . predniSONE (DELTASONE) 5 MG tablet        . TOPROL XL 100 MG 24 hr tablet TAKE ONE TABLET BY MOUTH ONCE DAILY.  30 each  3    Results for orders placed during the hospital encounter of 06/27/11 (from the past 48 hour(s))  GLUCOSE, CAPILLARY     Status: Abnormal   Collection Time   06/27/11  7:29 AM      Component Value Range Comment   Glucose-Capillary 151 (*) 70 - 99 (mg/dL)    No results found.  Review of Systems  Constitutional: Negative for weight loss.  Gastrointestinal: Negative for nausea, vomiting, abdominal pain, diarrhea, constipation, blood in stool and melena.    Blood pressure 139/93, pulse 92, temperature 99.3 F (37.4 C), temperature source Oral, resp. rate 16, SpO2 99.00%. Physical Exam  Constitutional: She appears well-developed and well-nourished.  HENT:  Mouth/Throat: Oropharynx is clear and moist.  Eyes: Conjunctivae are normal. No scleral icterus.  Neck: No thyromegaly present.  Cardiovascular: Normal rate, regular rhythm and normal heart sounds.   No murmur  heard. Respiratory: Effort normal and breath sounds normal.  GI: She exhibits no distension and no mass. There is no tenderness.  Musculoskeletal: She exhibits no edema.       Multiple findings involving both hands; she has muscle atrophy, swelling to metacarpal phalangeal joints in both hands  Lymphadenopathy:    She has no cervical adenopathy.  Neurological: She is alert.  Skin: Skin is warm and dry.     Assessment/Plan Heme positive stool and anemia. History of colonic adenoma and family history of colon carcinoma. Colonoscopy.  REHMAN,NAJEEB U 06/27/2011, 7:39 AM

## 2011-06-29 ENCOUNTER — Encounter: Payer: Self-pay | Admitting: Family Medicine

## 2011-06-30 LAB — CBC WITH DIFFERENTIAL/PLATELET
Basophils Absolute: 0 10*3/uL (ref 0.0–0.1)
Basophils Relative: 0 % (ref 0–1)
Hemoglobin: 10.5 g/dL — ABNORMAL LOW (ref 12.0–15.0)
Lymphocytes Relative: 5 % — ABNORMAL LOW (ref 12–46)
MCHC: 30.2 g/dL (ref 30.0–36.0)
Monocytes Relative: 6 % (ref 3–12)
Neutro Abs: 8 10*3/uL — ABNORMAL HIGH (ref 1.7–7.7)
Neutrophils Relative %: 89 % — ABNORMAL HIGH (ref 43–77)
WBC: 9 10*3/uL (ref 4.0–10.5)

## 2011-06-30 LAB — IRON AND TIBC: Iron: <10 ug/dL — ABNORMAL LOW (ref 42–145)

## 2011-06-30 LAB — HEMOGLOBIN A1C
Hgb A1c MFr Bld: 7.8 % — ABNORMAL HIGH (ref <5.7)
Mean Plasma Glucose: 177 mg/dL — ABNORMAL HIGH (ref <117)

## 2011-06-30 LAB — BASIC METABOLIC PANEL
Glucose, Bld: 232 mg/dL — ABNORMAL HIGH (ref 70–99)
Potassium: 4.4 mEq/L (ref 3.5–5.3)
Sodium: 139 mEq/L (ref 135–145)

## 2011-06-30 LAB — FOLATE: Folate: >20 ng/mL

## 2011-07-03 ENCOUNTER — Encounter (INDEPENDENT_AMBULATORY_CARE_PROVIDER_SITE_OTHER): Payer: Self-pay | Admitting: *Deleted

## 2011-07-04 ENCOUNTER — Encounter: Payer: Self-pay | Admitting: Family Medicine

## 2011-07-04 ENCOUNTER — Ambulatory Visit (INDEPENDENT_AMBULATORY_CARE_PROVIDER_SITE_OTHER): Payer: Medicare Other | Admitting: Family Medicine

## 2011-07-04 VITALS — BP 128/64 | HR 118 | Resp 18 | Ht 62.5 in | Wt 148.0 lb

## 2011-07-04 DIAGNOSIS — J209 Acute bronchitis, unspecified: Secondary | ICD-10-CM

## 2011-07-04 DIAGNOSIS — E119 Type 2 diabetes mellitus without complications: Secondary | ICD-10-CM

## 2011-07-04 DIAGNOSIS — M069 Rheumatoid arthritis, unspecified: Secondary | ICD-10-CM

## 2011-07-04 DIAGNOSIS — I10 Essential (primary) hypertension: Secondary | ICD-10-CM

## 2011-07-04 MED ORDER — SAXAGLIPTIN-METFORMIN ER 2.5-1000 MG PO TB24: ORAL_TABLET | ORAL | Status: DC

## 2011-07-04 MED ORDER — BENZONATATE 100 MG PO CAPS: 100.0000 mg | ORAL_CAPSULE | Freq: Four times a day (QID) | ORAL | Status: DC | PRN

## 2011-07-04 NOTE — Patient Instructions (Addendum)
F/u in early April.  You are being treated for acute bronchitis.Medication is sent to your pharmacy  Your blood sugar has improved a lot , congrats, keep it up.  I do not recommend surgery for your hernia at this time, if it becomes painful , then you will need a surgeon  HBA1C and chem 7 in April   Acute Bronchitis Bronchitis is when the organs and tissues involved in breathing get puffy (swollen) and can leak fluid. This makes it harder for air to get in and out of the lungs. You may cough a lot and produce thick spit (mucus). Acute means the illness started suddenly. HOME CARE  Rest.   Drink enough fluids to keep the pee (urine) clear or pale yellow.   Medicines may be given that will open up your airways to help you breathe better. Only take medicine as told by your doctor.   Use a cool mist vaporizer. This will help to thin any thick spit.   Do not smoke. Avoid secondhand smoke.  GET HELP RIGHT AWAY IF:   You have a temperature by mouth above 102 F (38.9 C), not controlled by medicine.   You have chills.   You develop severe shortness of breath or chest pain.   You have bloody spit mixed with mucus (sputum).   You throw up (vomit) often.   You lose too much body fluid (dehydrated).   You have a severe headache.   You feel faint.   You do not improve after 1 week of treatment.  MAKE SURE YOU:   Understand these instructions.   Will watch your condition.   Will get help right away if you are not doing well or get worse.  Document Released: 12/27/2007 Document Revised: 03/22/2011 Document Reviewed: 07/28/2009 [facility] Patient Information 2012 [place], [place].

## 2011-07-04 NOTE — Progress Notes (Signed)
  Subjective:    Patient ID: Stephanie Mays, female    DOB: 1951-07-22, 60 y.o.   MRN: [id number]  HPI 6 day h/o chest congestion, chills and thick yellow sputum. Had colonoscopy, needs rept in 3 years.  Currently taking 1 iron tablet will inc to 2 Tests morning sugars generally under 110   Review of Systems See HPI  Denies sinus pressure, nasal congestion, ear pain or sore throat. .Denies chest pains, palpitations and leg swelling Denies abdominal pain, nausea, vomiting,diarrhea or constipation.   Denies dysuria, frequency, hesitancy or incontinence.  Denies headaches, seizures, numbness, or tingling. Denies depression, anxiety or insomnia. Denies skin break down or rash.        Objective:   Physical Exam Patient alert and oriented and in no cardiopulmonary distress.  HEENT: No facial asymmetry, EOMI, no sinus tenderness,  oropharynx pink and moist.  Neck supple no adenopathy.  Chest: decreased air entry bilateral crackles and few wheezes  CVS: S1, S2 no murmurs, no S3.  ABD: Soft non tender. Bowel sounds normal.  Ext: No edema  MS: Adequate ROM spine, shoulders, hips and knees.deformity of joints in hands  Skin: Intact, no ulcerations or rash noted.  Psych: Good eye contact, normal affect. Memory intact not anxious or depressed appearing.  CNS: CN 2-12 intact, power, tone and sensation normal throughout.        Assessment & Plan:

## 2011-07-05 ENCOUNTER — Encounter (HOSPITAL_COMMUNITY): Payer: Self-pay | Admitting: Internal Medicine

## 2011-07-05 NOTE — Assessment & Plan Note (Signed)
Antibiotics and decongestants prescribed 

## 2011-07-07 MED ORDER — PENICILLIN V POTASSIUM 500 MG PO TABS: 500.0000 mg | ORAL_TABLET | Freq: Three times a day (TID) | ORAL | Status: AC

## 2011-07-07 NOTE — Assessment & Plan Note (Signed)
Improved and controlled , continue current meds

## 2011-07-07 NOTE — Assessment & Plan Note (Signed)
Stale and followed by rheumatologist

## 2011-07-07 NOTE — Assessment & Plan Note (Signed)
Controlled, no change in medication  

## 2011-07-23 ENCOUNTER — Other Ambulatory Visit: Payer: Self-pay | Admitting: Family Medicine

## 2011-08-05 ENCOUNTER — Other Ambulatory Visit: Payer: Self-pay | Admitting: Family Medicine

## 2011-08-25 ENCOUNTER — Encounter (INDEPENDENT_AMBULATORY_CARE_PROVIDER_SITE_OTHER): Payer: Self-pay | Admitting: *Deleted

## 2011-11-02 LAB — BASIC METABOLIC PANEL
BUN: 14 mg/dL (ref 6–23)
CO2: 21 mEq/L (ref 19–32)
Chloride: 99 mEq/L (ref 96–112)
Glucose, Bld: 197 mg/dL — ABNORMAL HIGH (ref 70–99)
Potassium: 4.5 mEq/L (ref 3.5–5.3)

## 2011-11-03 ENCOUNTER — Other Ambulatory Visit: Payer: Self-pay | Admitting: Family Medicine

## 2011-11-08 ENCOUNTER — Ambulatory Visit (INDEPENDENT_AMBULATORY_CARE_PROVIDER_SITE_OTHER): Payer: Medicare Other | Admitting: Family Medicine

## 2011-11-08 ENCOUNTER — Encounter: Payer: Self-pay | Admitting: Family Medicine

## 2011-11-08 VITALS — BP 128/78 | HR 106 | Resp 18 | Ht 62.5 in | Wt 151.0 lb

## 2011-11-08 DIAGNOSIS — D509 Iron deficiency anemia, unspecified: Secondary | ICD-10-CM

## 2011-11-08 DIAGNOSIS — Z13820 Encounter for screening for osteoporosis: Secondary | ICD-10-CM

## 2011-11-08 DIAGNOSIS — I10 Essential (primary) hypertension: Secondary | ICD-10-CM

## 2011-11-08 NOTE — Patient Instructions (Addendum)
F/U in 4 month  Blood sugar is not well controlled, you will get a script for a new meter. Test once daily, either  Before breakfast or around 10:30 at night, or 2 hours after dinner Goal for fasting blood sugar ranges from 80 to 120 and 2 hours after any meal or at bedtime should be between 130 to 170.   HBA1C, chem 7, cbc and anemia panel in 4 months, non fasting.  You are referred for a bone density test

## 2011-11-08 NOTE — Progress Notes (Signed)
  Subjective:    Patient ID: Stephanie Mays, female    DOB: 05/22/1951, 61 y.o.   MRN: [id number]  HPI The PT is here for follow up and re-evaluation of chronic medical conditions, medication management and review of any available recent lab and radiology data.  Preventive health is updated, specifically  Cancer screening and Immunization.   Questions or concerns regarding consultations or procedures which the PT has had in the interim are  addressed. The PT denies any adverse reactions to current medications since the last visit.  There are no new concerns.  There are no specific complaints .Has been testing faithfully, her log reveals excellent fasting sugars, seldom over 120, unfortunately HBA1C is elevated, meter is old      Review of Systems See HPI Denies recent fever or chills. Denies sinus pressure, nasal congestion, ear pain or sore throat. Denies chest congestion, productive cough or wheezing. Denies chest pains, palpitations and leg swelling Denies abdominal pain, nausea, vomiting,diarrhea or constipation.   Denies dysuria, frequency, hesitancy or incontinence. Chronic joint pain, swelling and limitation in mobility, has rheumatoid disease Denies headaches, seizures, numbness, or tingling. Denies depression, anxiety or insomnia. Denies skin break down or rash.        Objective:   Physical Exam Patient alert and oriented and in no cardiopulmonary distress.  HEENT: No facial asymmetry, EOMI, no sinus tenderness,  oropharynx pink and moist.  Neck supple no adenopathy.  Chest: Clear to auscultation bilaterally.  CVS: S1, S2 no murmurs, no S3.  ABD: Soft non tender. Bowel sounds normal.  Ext: No edema  MS: decreased ROM spine, shoulders, hips and knees.  Skin: Intact, no ulcerations or rash noted.  Psych: Good eye contact, normal affect. Memory intact not anxious or depressed appearing.  CNS: CN 2-12 intact, power, tone and sensation normal  throughout.        Assessment & Plan:

## 2011-11-10 ENCOUNTER — Other Ambulatory Visit: Payer: Self-pay | Admitting: Family Medicine

## 2011-11-12 NOTE — Assessment & Plan Note (Signed)
Controlled, no change in medication  

## 2011-11-12 NOTE — Assessment & Plan Note (Signed)
Deteriorated, resisting addition of insulin which I believe sh needs, will give one additional trial with new meter which will read accurately

## 2011-11-14 ENCOUNTER — Ambulatory Visit (HOSPITAL_COMMUNITY)
Admission: RE | Admit: 2011-11-14 | Discharge: 2011-11-14 | Disposition: A | Discharge status: Home / Self Care | Payer: Medicare Other | Source: Ambulatory Visit | Attending: Family Medicine | Admitting: Family Medicine

## 2011-11-14 DIAGNOSIS — Z780 Asymptomatic menopausal state: Secondary | ICD-10-CM | POA: Insufficient documentation

## 2011-11-14 DIAGNOSIS — M899 Disorder of bone, unspecified: Secondary | ICD-10-CM | POA: Insufficient documentation

## 2011-11-14 DIAGNOSIS — Z13820 Encounter for screening for osteoporosis: Secondary | ICD-10-CM

## 2011-11-14 DIAGNOSIS — M949 Disorder of cartilage, unspecified: Secondary | ICD-10-CM | POA: Insufficient documentation

## 2011-11-15 ENCOUNTER — Other Ambulatory Visit: Payer: Self-pay | Admitting: Family Medicine

## 2011-12-01 ENCOUNTER — Other Ambulatory Visit: Payer: Self-pay

## 2011-12-01 MED ORDER — ALENDRONATE SODIUM 70 MG PO TABS: 70.0000 mg | ORAL_TABLET | ORAL | Status: DC

## 2011-12-03 ENCOUNTER — Other Ambulatory Visit: Payer: Self-pay | Admitting: Family Medicine

## 2012-01-29 ENCOUNTER — Encounter (HOSPITAL_COMMUNITY): Payer: Self-pay | Admitting: *Deleted

## 2012-01-29 ENCOUNTER — Emergency Department (HOSPITAL_COMMUNITY)
Admission: EM | Admit: 2012-01-29 | Discharge: 2012-01-29 | Disposition: A | Discharge status: Home / Self Care | Payer: Medicare Other | Attending: Emergency Medicine | Admitting: Emergency Medicine

## 2012-01-29 ENCOUNTER — Emergency Department (HOSPITAL_COMMUNITY): Payer: Medicare Other

## 2012-01-29 DIAGNOSIS — R0602 Shortness of breath: Secondary | ICD-10-CM | POA: Insufficient documentation

## 2012-01-29 DIAGNOSIS — K859 Acute pancreatitis without necrosis or infection, unspecified: Secondary | ICD-10-CM | POA: Insufficient documentation

## 2012-01-29 DIAGNOSIS — E119 Type 2 diabetes mellitus without complications: Secondary | ICD-10-CM | POA: Insufficient documentation

## 2012-01-29 DIAGNOSIS — R10816 Epigastric abdominal tenderness: Secondary | ICD-10-CM | POA: Insufficient documentation

## 2012-01-29 DIAGNOSIS — I517 Cardiomegaly: Secondary | ICD-10-CM | POA: Insufficient documentation

## 2012-01-29 DIAGNOSIS — M069 Rheumatoid arthritis, unspecified: Secondary | ICD-10-CM | POA: Insufficient documentation

## 2012-01-29 DIAGNOSIS — E785 Hyperlipidemia, unspecified: Secondary | ICD-10-CM | POA: Insufficient documentation

## 2012-01-29 DIAGNOSIS — Z79899 Other long term (current) drug therapy: Secondary | ICD-10-CM | POA: Insufficient documentation

## 2012-01-29 DIAGNOSIS — R112 Nausea with vomiting, unspecified: Secondary | ICD-10-CM | POA: Insufficient documentation

## 2012-01-29 DIAGNOSIS — I10 Essential (primary) hypertension: Secondary | ICD-10-CM | POA: Insufficient documentation

## 2012-01-29 LAB — CARDIAC PANEL(CRET KIN+CKTOT+MB+TROPI)
CK, MB: 1.1 ng/mL (ref 0.3–4.0)
Total CK: 36 U/L (ref 7–177)
Troponin I: <0.3 ng/mL (ref <0.30)

## 2012-01-29 LAB — LIPASE, BLOOD: Lipase: 209 U/L — ABNORMAL HIGH (ref 11–59)

## 2012-01-29 LAB — BASIC METABOLIC PANEL
BUN: 13 mg/dL (ref 6–23)
Chloride: 94 mEq/L — ABNORMAL LOW (ref 96–112)
GFR calc non Af Amer: >90 mL/min (ref >90)
Glucose, Bld: 247 mg/dL — ABNORMAL HIGH (ref 70–99)
Potassium: 3.6 mEq/L (ref 3.5–5.1)

## 2012-01-29 LAB — CBC WITH DIFFERENTIAL/PLATELET
Basophils Relative: 0 % (ref 0–1)
Eosinophils Absolute: 0.1 10*3/uL (ref 0.0–0.7)
Eosinophils Relative: 1 % (ref 0–5)
MCH: 25.8 pg — ABNORMAL LOW (ref 26.0–34.0)
MCHC: 30.9 g/dL (ref 30.0–36.0)
MCV: 83.6 fL (ref 78.0–100.0)
Monocytes Relative: 13 % — ABNORMAL HIGH (ref 3–12)
Neutrophils Relative %: 70 % (ref 43–77)
Platelets: 289 10*3/uL (ref 150–400)

## 2012-01-29 LAB — HEPATIC FUNCTION PANEL
Total Bilirubin: 0.2 mg/dL — ABNORMAL LOW (ref 0.3–1.2)
Total Protein: 7.6 g/dL (ref 6.0–8.3)

## 2012-01-29 MED ORDER — ONDANSETRON HCL 8 MG PO TABS: 8.0000 mg | ORAL_TABLET | ORAL | Status: AC | PRN

## 2012-01-29 MED ORDER — SODIUM CHLORIDE 0.9 % IV SOLN
Freq: Once | INTRAVENOUS | Status: AC
Start: 2012-01-29 — End: 2012-01-29
  Administered 2012-01-29: 75 mL/h via INTRAVENOUS

## 2012-01-29 MED ORDER — ASPIRIN 81 MG PO CHEW
324.0000 mg | CHEWABLE_TABLET | Freq: Once | ORAL | Status: AC
  Administered 2012-01-29: 324 mg via ORAL
  Filled 2012-01-29: qty 4

## 2012-01-29 MED ORDER — HYDROMORPHONE HCL PF 1 MG/ML IJ SOLN
0.5000 mg | Freq: Once | INTRAMUSCULAR | Status: AC
  Administered 2012-01-29: 0.5 mg via INTRAVENOUS
  Filled 2012-01-29: qty 1

## 2012-01-29 MED ORDER — OXYCODONE-ACETAMINOPHEN 5-325 MG PO TABS: 1.0000 | ORAL_TABLET | Freq: Four times a day (QID) | ORAL | Status: AC | PRN

## 2012-01-29 MED ORDER — NITROGLYCERIN IN D5W 200-5 MCG/ML-% IV SOLN
5.0000 ug/min | Freq: Once | INTRAVENOUS | Status: AC
  Administered 2012-01-29: 5 ug/min via INTRAVENOUS
  Filled 2012-01-29: qty 250

## 2012-01-29 MED ORDER — ONDANSETRON HCL 4 MG/2ML IJ SOLN
4.0000 mg | Freq: Once | INTRAMUSCULAR | Status: AC
  Administered 2012-01-29: 4 mg via INTRAVENOUS
  Filled 2012-01-29: qty 2

## 2012-01-29 MED ORDER — NITROGLYCERIN 5 MG/ML IV SOLN: 5.0000 ug/kg/min | INTRAVENOUS | Status: DC

## 2012-01-29 NOTE — ED Provider Notes (Signed)
History     CSN: [id number]  Arrival date & time 01/29/12  0614   First MD Initiated Contact with Patient 01/29/12 [phone]      Chief Complaint  Patient presents with  . Nausea  . Emesis  . Abdominal Pain    (Consider location/radiation/quality/duration/timing/severity/associated sxs/prior treatment) HPI  Past Medical History  Diagnosis Date  . Anemia   . Diabetes mellitus   . Hypertension   . Hyperlipidemia   . Diabetic nephropathy   . Arthritis 1999    rheumatoid  . RA (rheumatoid arthritis)     Past Surgical History  Procedure Date  . Abdominal hysterectomy   . Colonoscopy 06/27/2011    Procedure: COLONOSCOPY;  Surgeon: Malissa Hippo, MD;  Location: [facility];  Service: Endoscopy;  Laterality: N/A;  7:30    Family History  Problem Relation Age of Onset  . Cancer Father     throat  . Diabetes Father   . Hypertension Father   . Diabetes Sister   . Hypertension Sister   . Diabetes Sister   . Cancer Brother     colon    History  Substance Use Topics  . Smoking status: Never Smoker   . Smokeless tobacco: Not on file  . Alcohol Use: No    OB History    Grav Para Term Preterm Abortions TAB SAB Ect Mult Living                  Review of Systems  Allergies  Review of patient's allergies indicates no known allergies.  Home Medications   Current Outpatient Rx  Name Route Sig Dispense Refill  . ALENDRONATE SODIUM 70 MG PO TABS Oral Take 70 mg by mouth every 7 (seven) days. Take with a full glass of water on an empty stomach. Takes on Wednesday.    Marland Kitchen AMLODIPINE BESYLATE-VALSARTAN 10-320 MG PO TABS Oral Take 1 tablet by mouth daily.    . ASPIRIN 81 MG PO TABS Oral Take 81 mg by mouth daily.      Marland Kitchen CITRACAL + D PO Oral Take 1 tablet by mouth daily.      Marland Kitchen FERROUS SULFATE 325 (65 FE) MG PO TABS Oral Take 325 mg by mouth daily.     Marland Kitchen FLAX SEED OIL PO Oral Take 1 tablet by mouth daily.      Marland Kitchen FOLIC ACID 1 MG PO TABS Oral Take 1 mg by mouth daily.        Marland Kitchen GLIPIZIDE 10 MG PO TABS Oral Take 1 tablet (10 mg total) by mouth 2 (two) times daily. 60 tablet 11    Dose increase effective 8/14 /2012  . HYDROCHLOROTHIAZIDE 25 MG PO TABS Oral Take 1 tablet (25 mg total) by mouth daily. 30 tablet 5  . HYDROCODONE-ACETAMINOPHEN 7.5-500 MG PO TABS Oral Take 1 tablet by mouth 4 (four) times daily as needed. Pain    . METHOTREXATE 2.5 MG PO TABS Oral Take 20 mg by mouth once a week. 8 tabs once weekly. On Friday    . METOPROLOL SUCCINATE ER 100 MG PO TB24 Oral Take 100 mg by mouth daily. Take with or immediately following a meal.    . ONE-DAILY MULTI VITAMINS PO TABS Oral Take 1 tablet by mouth daily.      Marland Kitchen NAPROXEN 500 MG PO TABS Oral Take 500 mg by mouth as needed. Pain    . PREDNISONE 5 MG PO TABS Oral Take 10 mg by mouth daily.     Marland Kitchen  SAXAGLIPTIN-METFORMIN ER 2.11-998 MG PO TB24 Oral Take 1 tablet by mouth 2 (two) times daily.    Marland Kitchen ONDANSETRON HCL 8 MG PO TABS Oral Take 1 tablet (8 mg total) by mouth every 4 (four) hours as needed for nausea. 10 tablet 0  . OXYCODONE-ACETAMINOPHEN 5-325 MG PO TABS Oral Take 1-2 tablets by mouth every 6 (six) hours as needed for pain. 20 tablet 0    BP 126/77  Pulse 81  Temp 98.4 F (36.9 C) (Oral)  Resp 15  SpO2 100%  Physical Exam  ED Course  Procedures (including critical care time)  Labs Reviewed  CBC WITH DIFFERENTIAL - Abnormal; Notable for the following:    WBC 10.6 (*)     Hemoglobin 11.5 (*)     MCH 25.8 (*)     RDW 19.8 (*)     Monocytes Relative 13 (*)     Monocytes Absolute 1.4 (*)     All other components within normal limits  LIPASE, BLOOD - Abnormal; Notable for the following:    Lipase 209 (*)     All other components within normal limits  HEPATIC FUNCTION PANEL - Abnormal; Notable for the following:    Alkaline Phosphatase 131 (*)     Total Bilirubin 0.2 (*)     All other components within normal limits  BASIC METABOLIC PANEL - Abnormal; Notable for the following:    Sodium 133 (*)      Chloride 94 (*)     Glucose, Bld 247 (*)     All other components within normal limits  CARDIAC PANEL(CRET KIN+CKTOT+MB+TROPI)   Dg Chest Port 1 View  01/29/2012  *RADIOLOGY REPORT*  Clinical Data: Shortness of breath.  Epigastric pain.  PORTABLE CHEST - 1 VIEW  Comparison: CT scan of the abdomen dated 07/17/2011  Findings: Heart size and pulmonary vascularity are normal and the lungs are clear.  Chronic elevation of the right hemidiaphragm. Mediastinum appears slightly prominent.  This is probably relates to the patient's obesity.  No significant osseous abnormality.  IMPRESSION: No acute abnormalities in the chest.  Original Report Authenticated By: Gwynn Burly, M.D.     1. Pancreatitis   2. LVH (left ventricular hypertrophy)       MDM  Patient tolerating fluids well.  No acute abdomen. Discharge home with Percocet and Zofran. Follow up with primary care doctor.        Donnetta Hutching, MD 01/29/12 [phone]

## 2012-01-29 NOTE — ED Provider Notes (Signed)
History     CSN: [id number]  Arrival date & time 01/29/12  0614   First MD Initiated Contact with Patient 01/29/12 [phone]      Chief Complaint  Patient presents with  . Nausea  . Emesis  . Abdominal Pain    (Consider location/radiation/quality/duration/timing/severity/associated sxs/prior treatment) HPI Comments: 61 year old female with a history of diabetes, hypertension, anemia, hyperlipidemia, rheumatoid arthritis who presents with a complaint of chest pain and epigastric pain. She states this started approximately 1:00 last night, has been persistent and associated with nausea, does not radiate to the shoulders or the back or the jaw and is associated with mild shortness of breath. She has had some vomiting with this. She denies having a heart attack in the past but has had some cardiac testing by her family doctor including an EKG at the office and an echocardiogram showing normal ejection fraction and normal appearing left ventricle which was done in August of 2012. The patient states she feels like she needs to belch but does not, she is nauseated and vomiting. She does not drink alcohol and does no drink cigarettes. She believes that her father had cardiac disease.  Patient is a 61 y.o. female presenting with vomiting and abdominal pain. The history is provided by the patient, a friend and medical records.  Emesis  Associated symptoms include abdominal pain.  Abdominal Pain The primary symptoms of the illness include abdominal pain and vomiting.    Past Medical History  Diagnosis Date  . Anemia   . Diabetes mellitus   . Hypertension   . Hyperlipidemia   . Diabetic nephropathy   . Arthritis 1999    rheumatoid  . RA (rheumatoid arthritis)     Past Surgical History  Procedure Date  . Abdominal hysterectomy   . Colonoscopy 06/27/2011    Procedure: COLONOSCOPY;  Surgeon: Malissa Hippo, MD;  Location: [facility];  Service: Endoscopy;  Laterality: N/A;  7:30    Family  History  Problem Relation Age of Onset  . Cancer Father     throat  . Diabetes Father   . Hypertension Father   . Diabetes Sister   . Hypertension Sister   . Diabetes Sister   . Cancer Brother     colon    History  Substance Use Topics  . Smoking status: Never Smoker   . Smokeless tobacco: Not on file  . Alcohol Use: No    OB History    Grav Para Term Preterm Abortions TAB SAB Ect Mult Living                  Review of Systems  Gastrointestinal: Positive for vomiting and abdominal pain.  All other systems reviewed and are negative.    Allergies  Review of patient's allergies indicates no known allergies.  Home Medications   Current Outpatient Rx  Name Route Sig Dispense Refill  . ALENDRONATE SODIUM 70 MG PO TABS Oral Take 1 tablet (70 mg total) by mouth every 7 (seven) days. Take with a full glass of water on an empty stomach. 4 tablet 11  . EXFORGE 10-320 MG PO TABS  TAKE 1 TABLET EVERY DAY. 30 each 3  . FOLIC ACID 1 MG PO TABS Oral Take 1 mg by mouth daily.      Marland Kitchen GLIPIZIDE 10 MG PO TABS Oral Take 1 tablet (10 mg total) by mouth 2 (two) times daily. 60 tablet 11    Dose increase effective 8/14 /2012  .  HYDROCODONE-ACETAMINOPHEN 7.5-500 MG PO TABS Oral Take 1 tablet by mouth 4 (four) times daily as needed.      Marland Kitchen KOMBIGLYZE XR 2.11-998 MG PO TB24  TAKE 1 TABLET BY MOUTH TWICE A DAY WITH FOOD FOR DIABETES. 60 tablet 3  . METHOTREXATE 2.5 MG PO TABS Oral Take 2.5 mg by mouth once a week. 8 tabs once weekly. On Friday    . PREDNISONE 5 MG PO TABS       . TOPROL XL 100 MG PO TB24  TAKE ONE TABLET BY MOUTH ONCE DAILY. 30 each 3  . ACCU-CHEK AVIVA PLUS VI STRP  TEST ONCE DAILY AS DIRECTED. 50 each 3  . ASPIRIN 81 MG PO TABS Oral Take 81 mg by mouth daily.      Marland Kitchen CITRACAL + D PO Oral Take 1 tablet by mouth daily.      Marland Kitchen FERROUS SULFATE 325 (65 FE) MG PO TABS Oral Take 325 mg by mouth 2 (two) times daily.      Marland Kitchen FLAX SEED OIL PO Oral Take 1 tablet by mouth daily.      Marland Kitchen  HYDROCHLOROTHIAZIDE 25 MG PO TABS Oral Take 1 tablet (25 mg total) by mouth daily. 30 tablet 5  . ONE-DAILY MULTI VITAMINS PO TABS Oral Take 1 tablet by mouth daily.      Marland Kitchen NAPROXEN 500 MG PO TABS Oral Take 500 mg by mouth as needed.     Marland Kitchen PEG-KCL-NACL-NASULF-NA ASC-C 100 G PO SOLR Oral Take 1 kit (100 g total) by mouth once. 1 kit 0    BP 164/89  Pulse 76  Temp 98.4 F (36.9 C) (Oral)  SpO2 100%  Physical Exam  Nursing note and vitals reviewed. Constitutional: She appears well-developed and well-nourished.       Uncomfortable appearing  HENT:  Head: Normocephalic and atraumatic.  Mouth/Throat: Oropharynx is clear and moist. No oropharyngeal exudate.  Eyes: Conjunctivae and EOM are normal. Pupils are equal, round, and reactive to light. Right eye exhibits no discharge. Left eye exhibits no discharge. No scleral icterus.  Neck: Normal range of motion. Neck supple. No JVD present. No thyromegaly present.  Cardiovascular: Normal rate, regular rhythm, normal heart sounds and intact distal pulses.  Exam reveals no gallop and no friction rub.   No murmur heard. Pulmonary/Chest: Effort normal and breath sounds normal. No respiratory distress. She has no wheezes. She has no rales. She exhibits no tenderness.  Abdominal: Soft. Bowel sounds are normal. She exhibits no distension and no mass. There is tenderness ( minimal epigastric tenderness, no guarding, obese).  Musculoskeletal: Normal range of motion. She exhibits no edema and no tenderness.  Lymphadenopathy:    She has no cervical adenopathy.  Neurological: She is alert. Coordination normal.  Skin: Skin is warm and dry. No rash noted. No erythema.  Psychiatric: She has a normal mood and affect. Her behavior is normal.    ED Course  Procedures (including critical care time)   Labs Reviewed  CARDIAC PANEL(CRET KIN+CKTOT+MB+TROPI)  CBC WITH DIFFERENTIAL  LIPASE, BLOOD  HEPATIC FUNCTION PANEL   No results found.   No diagnosis  found.    MDM  The patient appears uncomfortable, her vital signs are fairly unremarkable other than mild hypertension, her EKG is abnormal showing signs of left ventricular hypertrophy as well as some T-wave inversions in the inferior leads and T wave abnormalities in the precordium. Will obtain troponin which are likely related to the left ventricular hypertrophy, lipase, labs, chest  x-ray, cardiac monitoring, aspirin, oxygen.  ED ECG REPORT  I personally interpreted this EKG   Date: 01/29/2012   Rate: 74  Rhythm: normal sinus rhythm  QRS Axis: normal  Intervals: normal  ST/T Wave abnormalities: nonspecific ST/T changes  Conduction Disutrbances:none  Narrative Interpretation: Left ventricular hypertrophy present  Old EKG Reviewed: none available  On reevaluation prior to the patient getting any medications, she states that she is much improved, nausea has resolved and upper abdominal pain is minimal. Review of the laboratory data suggests that the patient has acute pancreatitis.she does not drink alcohol and has no tenderness over her gallbladder or liver. In fact on reexamination the patient has no abdominal tenderness.her lipase is 209, the upper limits of normal is 59. Her white blood cell count is 10,600, no significant anemia and no significant liver function elevations. We'll give a small dose of hydromorphone and reevaluate.  Change of shift - care signed out to Dr. Adriana Simas.       Vida Roller, MD 01/29/12 [phone]

## 2012-01-29 NOTE — ED Notes (Signed)
Pt states she woke up with upper abdominal pain, nausea, and vomiting.

## 2012-01-29 NOTE — ED Notes (Signed)
Patient ambulatory to restroom with steady gait.  Patient able to tolerate PO fluids well.

## 2012-02-03 ENCOUNTER — Other Ambulatory Visit: Payer: Self-pay | Admitting: Family Medicine

## 2012-02-26 ENCOUNTER — Other Ambulatory Visit: Payer: Self-pay | Admitting: Family Medicine

## 2012-03-04 ENCOUNTER — Other Ambulatory Visit: Payer: Self-pay | Admitting: Family Medicine

## 2012-03-05 LAB — BASIC METABOLIC PANEL
CO2: 27 mEq/L (ref 19–32)
Chloride: 100 mEq/L (ref 96–112)
Glucose, Bld: 203 mg/dL — ABNORMAL HIGH (ref 70–99)
Potassium: 4.8 mEq/L (ref 3.5–5.3)
Sodium: 139 mEq/L (ref 135–145)

## 2012-03-05 LAB — HEMOGLOBIN A1C: Mean Plasma Glucose: 186 mg/dL — ABNORMAL HIGH (ref <117)

## 2012-03-05 LAB — CBC
HCT: 35.4 % — ABNORMAL LOW (ref 36.0–46.0)
Hemoglobin: 11.5 g/dL — ABNORMAL LOW (ref 12.0–15.0)
MCHC: 32.5 g/dL (ref 30.0–36.0)
MCV: 79.9 fL (ref 78.0–100.0)
RDW: 18.8 % — ABNORMAL HIGH (ref 11.5–15.5)

## 2012-03-07 ENCOUNTER — Encounter: Payer: Self-pay | Admitting: Family Medicine

## 2012-03-07 ENCOUNTER — Ambulatory Visit (INDEPENDENT_AMBULATORY_CARE_PROVIDER_SITE_OTHER): Payer: Medicare Other | Admitting: Family Medicine

## 2012-03-07 VITALS — BP 120/70 | HR 86 | Resp 16 | Ht 62.5 in | Wt 148.0 lb

## 2012-03-07 DIAGNOSIS — M069 Rheumatoid arthritis, unspecified: Secondary | ICD-10-CM

## 2012-03-07 DIAGNOSIS — I10 Essential (primary) hypertension: Secondary | ICD-10-CM

## 2012-03-07 DIAGNOSIS — D509 Iron deficiency anemia, unspecified: Secondary | ICD-10-CM

## 2012-03-07 DIAGNOSIS — IMO0001 Reserved for inherently not codable concepts without codable children: Secondary | ICD-10-CM

## 2012-03-07 MED ORDER — INSULIN GLARGINE 100 UNIT/ML ~~LOC~~ SOLN: SUBCUTANEOUS | Status: DC

## 2012-03-07 NOTE — Patient Instructions (Addendum)
F/u in   6 weeks.Bring log book and meter  You need to start long acting insulin 7 units once daily , at breakfast   Please bring your meter for [place] to check that it is working correctly  We will add an iron level to your current labs and let you know the follow up from there, if still low you will be referred to the hematologist

## 2012-03-08 ENCOUNTER — Other Ambulatory Visit: Payer: Self-pay | Admitting: Family Medicine

## 2012-03-08 DIAGNOSIS — D509 Iron deficiency anemia, unspecified: Secondary | ICD-10-CM

## 2012-03-08 NOTE — Assessment & Plan Note (Signed)
Needs re check on iron level if still very low refer to hematology

## 2012-03-08 NOTE — Assessment & Plan Note (Signed)
Maintained on oral steroids and immunotherapy followed by rheumatology

## 2012-03-08 NOTE — Assessment & Plan Note (Signed)
Controlled, no change in medication DASH diet and commitment to daily physical activity for a minimum of 30 minutes discussed and encouraged, as a part of hypertension management. The importance of attaining a healthy weight is also discussed.  

## 2012-03-08 NOTE — Progress Notes (Signed)
  Subjective:    Patient ID: Stephanie Mays, female    DOB: 1950-08-21, 61 y.o.   MRN: [id number]  HPI The PT is here for follow up and re-evaluation of chronic medical conditions, medication management and review of any available recent lab and radiology data.  Preventive health is updated, specifically  Cancer screening and Immunization.   Questions or concerns regarding consultations or procedures which the PT has had in the interim are  addressed. The PT denies any adverse reactions to current medications since the last visit.  States she has been doing well, has perfect log of blood sugars which are checked, unfortunately hBA1C has worsened. Denies polyuria , polydipsia, blurred vision or hypoglycemic episodes Exercise is limited by chronic joint pain from arthritis     Review of Systems See HPI Denies recent fever or chills. Denies sinus pressure, nasal congestion, ear pain or sore throat. Denies chest congestion, productive cough or wheezing. Denies chest pains, palpitations and leg swelling Denies abdominal pain, nausea, vomiting,diarrhea or constipation.   Denies dysuria, frequency, hesitancy or incontinence.  Denies headaches, seizures, numbness, or tingling. Denies depression, anxiety or insomnia. Denies skin break down or rash.        Objective:   Physical Exam .Patient alert and oriented and in no cardiopulmonary distress.  HEENT: No facial asymmetry, EOMI, no sinus tenderness,  oropharynx pink and moist.  Neck supple no adenopathy.  Chest: Clear to auscultation bilaterally.  CVS: S1, S2 no murmurs, no S3.  ABD: Soft non tender. Bowel sounds normal.  Ext: No edema  MS: Adequate ROM spine, shoulders, hips and knees.Marked deformity of joints in hands  Skin: Intact, no ulcerations or rash noted.  Psych: Good eye contact, normal affect. Memory intact not anxious or depressed appearing.  CNS: CN 2-12 intact, power, tone and sensation normal  throughout.         Assessment & Plan:

## 2012-03-08 NOTE — Assessment & Plan Note (Signed)
Pt's recordings show perfect numbers, but her HBA1C is poor, will need to add insulin, also pt to return wit meter for this to be checked

## 2012-03-09 ENCOUNTER — Other Ambulatory Visit: Payer: Self-pay | Admitting: Family Medicine

## 2012-03-11 ENCOUNTER — Other Ambulatory Visit: Payer: Self-pay | Admitting: Family Medicine

## 2012-03-11 DIAGNOSIS — Z139 Encounter for screening, unspecified: Secondary | ICD-10-CM

## 2012-03-14 ENCOUNTER — Ambulatory Visit (HOSPITAL_COMMUNITY)
Admission: RE | Admit: 2012-03-14 | Discharge: 2012-03-14 | Disposition: A | Discharge status: Home / Self Care | Payer: Medicare Other | Source: Ambulatory Visit | Attending: Family Medicine | Admitting: Family Medicine

## 2012-03-14 DIAGNOSIS — Z139 Encounter for screening, unspecified: Secondary | ICD-10-CM

## 2012-03-14 DIAGNOSIS — Z1231 Encounter for screening mammogram for malignant neoplasm of breast: Secondary | ICD-10-CM | POA: Insufficient documentation

## 2012-03-26 ENCOUNTER — Encounter (HOSPITAL_COMMUNITY): Payer: Self-pay | Admitting: Oncology

## 2012-03-26 ENCOUNTER — Encounter (HOSPITAL_COMMUNITY): Payer: Medicare Other | Attending: Oncology | Admitting: Oncology

## 2012-03-26 ENCOUNTER — Ambulatory Visit (HOSPITAL_COMMUNITY): Payer: Medicare Other | Admitting: Oncology

## 2012-03-26 VITALS — BP 145/84 | HR 112 | Temp 99.6°F | Resp 16 | Ht 62.25 in | Wt 147.9 lb

## 2012-03-26 DIAGNOSIS — D509 Iron deficiency anemia, unspecified: Secondary | ICD-10-CM | POA: Insufficient documentation

## 2012-03-26 DIAGNOSIS — D649 Anemia, unspecified: Secondary | ICD-10-CM

## 2012-03-26 DIAGNOSIS — M069 Rheumatoid arthritis, unspecified: Secondary | ICD-10-CM

## 2012-03-26 DIAGNOSIS — E119 Type 2 diabetes mellitus without complications: Secondary | ICD-10-CM | POA: Insufficient documentation

## 2012-03-26 DIAGNOSIS — IMO0001 Reserved for inherently not codable concepts without codable children: Secondary | ICD-10-CM

## 2012-03-26 DIAGNOSIS — I10 Essential (primary) hypertension: Secondary | ICD-10-CM | POA: Insufficient documentation

## 2012-03-26 LAB — CBC WITH DIFFERENTIAL/PLATELET
Eosinophils Absolute: 0 10*3/uL (ref 0.0–0.7)
Eosinophils Relative: 0 % (ref 0–5)
Hemoglobin: 10.4 g/dL — ABNORMAL LOW (ref 12.0–15.0)
Lymphs Abs: 0.7 10*3/uL (ref 0.7–4.0)
MCH: 26.2 pg (ref 26.0–34.0)
MCV: 82.4 fL (ref 78.0–100.0)
Monocytes Relative: 8 % (ref 3–12)
RBC: 3.97 MIL/uL (ref 3.87–5.11)

## 2012-03-26 LAB — RETICULOCYTES
Retic Count, Absolute: 47.6 10*3/uL (ref 19.0–186.0)
Retic Ct Pct: 1.2 % (ref 0.4–3.1)

## 2012-03-26 LAB — SEDIMENTATION RATE: Sed Rate: 67 mm/hr — ABNORMAL HIGH (ref 0–22)

## 2012-03-26 NOTE — Patient Instructions (Addendum)
Stephanie Mays  DOB 03-09-1951 CSN [id number]  MRN [id number] Dr. Glenford Peers   [facility]  Discharge Instructions  RECOMMENDATIONS MADE BY THE CONSULTANT AND ANY TEST RESULTS WILL BE SENT TO YOUR REFERRING DOCTOR.   EXAM FINDINGS BY MD TODAY AND SIGNS AND SYMPTOMS TO REPORT TO CLINIC OR PRIMARY MD: Exam and discussion by Dr. Mariel Sleet and Dellis Anes.  We will check some blood work today and if there is anything abnormal we will call you.  MEDICATIONS PRESCRIBED: none - you can stop iron for now.   INSTRUCTIONS GIVEN AND DISCUSSED: Other :  Report increased fatigue, increased shortness of breath, increased ice intake, etc.  SPECIAL INSTRUCTIONS/FOLLOW-UP: Lab work Needed today and Return to Clinic in about 4 weeks for follow-up.   I acknowledge that I have been informed and understand all the instructions given to me and received a copy. I do not have any more questions at this time, but understand that I may call the Specialty Clinic at [facility] at [phone] during business hours should I have any further questions or need assistance in obtaining follow-up care.    __________________________________________  _____________  __________ Signature of Patient or Authorized Representative            Date                   Time    __________________________________________ Nurse's Signature

## 2012-03-26 NOTE — Progress Notes (Signed)
Stephanie Mays presented for [employer]. Labs per MD order drawn via Peripheral Line 23 gauge needle inserted in left AC  Good blood return present. Procedure without incident.  Needle removed intact. Patient tolerated procedure well.

## 2012-03-26 NOTE — Progress Notes (Signed)
[facility] NEW PATIENT EVALUATION   Name: Stephanie Mays Date: 03/26/2012 MRN: [id number] DOB: 1950/09/13    CC: Stephanie Overman, MD     DIAGNOSIS: The primary encounter diagnosis was DIABETES MELLITUS, TYPE II, UNCONTROLLED. Diagnoses of ANEMIA, IRON DEFICIENCY, HYPERTENSION, and ARTHRITIS, RHEUMATOID were also pertinent to this visit.   HISTORY OF PRESENT ILLNESS:Stephanie Mays is a 61 y.o. African American female who has a past medical history significant for iron deficiency, poorly controlled DM, rheumatoid arthritis, HTN, and hyperlipidemia who was referred to the [facility] for further evaluation and potential treatment for her anemia.   The patient report that she has poorly controlled DM.  Her last hemoglobin A1c was 8.1%.  Her PCP has initiated insulin therapy.   Her glucose this AM was 98 she reports.    She also has rheumatoid arthritis and is treated by Dr. Dareen Piano (Rheumatologist) in [place] with Methotrexate 20 mg every Friday and Prednisone 10 mg daily.  She was diagnosed in 43.  The joints that are mostly affected are the hands and wrists, elbows, shoulders, knees.   She is on Iron pill, ferrous sulfate 325 mg but she reports that she is not always compliant with this medication.  She has been taking iron PO since 2007 and reports that her stools are dark when she takes iron. Unfortunately, PO iron causes her constipation.  Therefore, we have asked her to hold her PO iron.   She denies any syncopal episodes, chest pain, headaches, dizziness, double vision, fevers, chills, night sweats, nausea, vomiting, diarrhea, constipation, abdominal pain, blood in stool, black tarry stool, change in stool caliber or color, urinary pain/burning/frequency, hematuria, vaginal spotting, gingival bleeding, or any blood loss.     FAMILY HISTORY: family history includes Cancer in her brother and father; Diabetes in her father and sisters; and Hypertension in her  father and sister.  The patient's father passed away in his 70's due to "throat cancer."  Her mom passed away in her 23's from leukemia.  She has a deceased sister from Hodgkin's disease who died at the age of 51.  She has another brother who passed from cancer.  She has 2 brothers living, on 84 years old and the other is 31.  One of them has rheumatoid arthritis.   PAST MEDICAL HISTORY:  has a past medical history of Anemia; Diabetes mellitus; Hypertension; Hyperlipidemia; Diabetic nephropathy; Arthritis (1999); and RA (rheumatoid arthritis).      CURRENT MEDICATIONS: See [facility], of note, Methotrexate 20 mg every Friday, Prednisone 10 mg daily, ferrous sulfate 325 mg (not compliant), ASA 81 mg, Folic Acid 1 mg.  SOCIAL HISTORY:  reports that she has never smoked. She has never used smokeless tobacco. She reports that she does not drink alcohol or use illicit drugs.  Patient lives in and is from [place], [place]. She completed 11th grade of high school.  She worked in [employer] social research officer for 32 years and is now on disability due to her arthritis.  She has never been married.  She lives with her 2 brothers.  She denies EtOH, Tobacco, and illicit drug abuse.    ALLERGIES: Review of patient's allergies indicates no known allergies.   LABORATORY DATA:  CBC    Component Value Date/Time   WBC 9.8 03/04/2012 1700   RBC 4.43 03/04/2012 1700   HGB 11.5* 03/04/2012 1700   HCT 35.4* 03/04/2012 1700   PLT 422* 03/04/2012 1700   MCV 79.9 03/04/2012 1700   MCH  26.0 03/04/2012 1700   MCHC 32.5 03/04/2012 1700   RDW 18.8* 03/04/2012 1700   LYMPHSABS 1.7 01/29/2012 0654   MONOABS 1.4* 01/29/2012 0654   EOSABS 0.1 01/29/2012 0654   BASOSABS 0.0 01/29/2012 0654      Chemistry      Component Value Date/Time   NA 139 03/04/2012 1700   K 4.8 03/04/2012 1700   CL 100 03/04/2012 1700   CO2 27 03/04/2012 1700   BUN 11 03/04/2012 1700   CREATININE 0.78 03/04/2012 1700   CREATININE 0.62 01/29/2012 0654      Component Value Date/Time    CALCIUM 10.8* 03/04/2012 1700   ALKPHOS 131* 01/29/2012 0654   AST 14 01/29/2012 0654   ALT 28 01/29/2012 0654   BILITOT 0.2* 01/29/2012 0654     Lab Results  Component Value Date   IRON 18* 03/04/2012   TIBC Comment: Not calculated due to Iron <10. 06/29/2011   FERRITIN 185 06/29/2011       REVIEW OF SYSTEMS: Patient reports no health concerns.   PHYSICAL EXAM:  height is 5' 2.25" (1.581 m) and weight is 147 lb 14.4 oz (67.087 kg). Her oral temperature is 99.6 F (37.6 C). Her blood pressure is 145/84 and her pulse is 112. Her respiration is 16.  General appearance: alert, cooperative, appears older than stated age, no distress and moderately obese Head: Normocephalic, without obvious abnormality, atraumatic Neck: no adenopathy, supple, symmetrical, trachea midline and thyroid not enlarged, symmetric, no tenderness/mass/nodules Lymph nodes: Cervical, supraclavicular, and axillary nodes normal. Resp: clear to auscultation bilaterally and normal percussion bilaterally Cardio: regular rate and rhythm, S1, S2 normal, no murmur, click, rub or gallop GI: soft, non-tender; bowel sounds normal; no masses,  no organomegaly Extremities: ulnar deviation with enlarged joints classically noted with rheumatoid arthritis. Neurologic: Alert and oriented X 3, normal strength and tone. Normal symmetric reflexes. Normal coordination and gait     IMPRESSION:  1. Multifactorial normocytic anemia which is stable with a Hgb of 11.5, with ?iron deficiency, drug-induced (methotrexate), and anemia of chronic disease with poorly controlled DM and HTN.  2. Iron deficiency, on oral iron (noncompliant) 3. Rheumatoid arthritis, followed by Dr. Dareen Piano (Rheumatologist), on 20 mg Methotrexate every Friday and 10 mg of Prednisone which likely the culprit of her minimal anemia.  4. Poorly controlled DM, started insulin per PCP. 5. HTN   PLAN:  1. I personally reviewed and went over laboratory results with the  patient. 2. Patient education regarding anemia.  3. Lab work today: CBC diff, Iron/TIBC, Ferritin, Retic Count, ESR 4. Patient education regarding iron deficiency. 5. Patient education regarding the role of hemoglobin with in the body. 6. Risks, benefits, alternatives, and side effects of IV Feraheme discussed in the event that she may need it.  7. Hold PO iron due to constipation.  8. Return in 4 weeks for follow-up.   Patient knows to call the clinic with any questions or concerns.    Patient and plan discussed with Dr. Glenford Peers and he is in agreement with the aforementioned.  Patient seen and examined by Dr. Glenford Peers as well.   KEFALAS,THOMAS

## 2012-03-27 ENCOUNTER — Other Ambulatory Visit (HOSPITAL_COMMUNITY): Payer: Self-pay | Admitting: Oncology

## 2012-03-27 DIAGNOSIS — D509 Iron deficiency anemia, unspecified: Secondary | ICD-10-CM

## 2012-03-27 LAB — FERRITIN: Ferritin: 81 ng/mL (ref 10–291)

## 2012-04-02 ENCOUNTER — Encounter (HOSPITAL_BASED_OUTPATIENT_CLINIC_OR_DEPARTMENT_OTHER): Payer: Medicare Other

## 2012-04-02 VITALS — BP 129/71 | HR 102 | Temp 99.9°F | Resp 20

## 2012-04-02 DIAGNOSIS — D509 Iron deficiency anemia, unspecified: Secondary | ICD-10-CM

## 2012-04-02 MED ORDER — SODIUM CHLORIDE 0.9 % IJ SOLN
INTRAMUSCULAR | Status: AC
  Filled 2012-04-02: qty 10

## 2012-04-02 MED ORDER — SODIUM CHLORIDE 0.9 % IJ SOLN
10.0000 mL | INTRAMUSCULAR | Status: DC | PRN
  Administered 2012-04-02: 10 mL via INTRAVENOUS
  Filled 2012-04-02: qty 10

## 2012-04-02 MED ORDER — SODIUM CHLORIDE 0.9 % IV SOLN
INTRAVENOUS | Status: DC
  Administered 2012-04-02: 14:00:00 via INTRAVENOUS

## 2012-04-02 MED ORDER — SODIUM CHLORIDE 0.9 % IV SOLN
1020.0000 mg | Freq: Once | INTRAVENOUS | Status: AC
  Administered 2012-04-02: 1020 mg via INTRAVENOUS
  Filled 2012-04-02: qty 34

## 2012-04-02 NOTE — Progress Notes (Signed)
Tolerated infusion well. 

## 2012-04-11 ENCOUNTER — Other Ambulatory Visit: Payer: Self-pay | Admitting: Family Medicine

## 2012-04-18 ENCOUNTER — Ambulatory Visit: Payer: Medicare Other | Admitting: Family Medicine

## 2012-04-24 ENCOUNTER — Encounter: Payer: Self-pay | Admitting: Family Medicine

## 2012-04-24 ENCOUNTER — Ambulatory Visit (INDEPENDENT_AMBULATORY_CARE_PROVIDER_SITE_OTHER): Payer: Medicare Other | Admitting: Family Medicine

## 2012-04-24 VITALS — BP 126/74 | HR 100 | Resp 18 | Wt 148.0 lb

## 2012-04-24 DIAGNOSIS — Z13220 Encounter for screening for lipoid disorders: Secondary | ICD-10-CM

## 2012-04-24 DIAGNOSIS — M899 Disorder of bone, unspecified: Secondary | ICD-10-CM

## 2012-04-24 DIAGNOSIS — E1149 Type 2 diabetes mellitus with other diabetic neurological complication: Secondary | ICD-10-CM

## 2012-04-24 DIAGNOSIS — M069 Rheumatoid arthritis, unspecified: Secondary | ICD-10-CM

## 2012-04-24 DIAGNOSIS — M949 Disorder of cartilage, unspecified: Secondary | ICD-10-CM

## 2012-04-24 DIAGNOSIS — Z23 Encounter for immunization: Secondary | ICD-10-CM

## 2012-04-24 DIAGNOSIS — I10 Essential (primary) hypertension: Secondary | ICD-10-CM

## 2012-04-24 NOTE — Patient Instructions (Addendum)
Annual wellness early December.Please call if you need me before  Blood sugars look much better. Ok to take the insulin at bedtime  Flu vaccine and microalb today   Fasting lipid, cmp and EGFR, HBA1C , TSH and vit D in December before visit.  No med changes

## 2012-04-24 NOTE — Progress Notes (Signed)
  Subjective:    Patient ID: Stephanie Mays, female    DOB: 1950-11-07, 61 y.o.   MRN: [id number]  HPI Follow up uncontrolled diabetes, new on injections, wants to take at night since fingers stiff in the morning.  Fasting sugars are ranging from 103  To 121, doing well with this otherwise. Denies any recent fever or chills , has no complaints   Review of Systems See HPI Denies recent fever or chills. Denies sinus pressure, nasal congestion, ear pain or sore throat. Denies chest congestion, productive cough or wheezing. Denies chest pains, palpitations and leg swelling Denies abdominal pain, nausea, vomiting,diarrhea or constipation.   Denies dysuria, frequency, hesitancy or incontinence. Chronic  joint pain, swelling and limitation in mobility. Denies headaches, seizures, numbness, or tingling. Denies depression, anxiety or insomnia. Denies skin break down or rash.        Objective:   Physical Exam  Patient alert and oriented and in no cardiopulmonary distress.  HEENT: No facial asymmetry, EOMI, no sinus tenderness,  oropharynx pink and moist.  Neck supple no adenopathy.  Chest: Clear to auscultation bilaterally.  CVS: S1, S2 no murmurs, no S3.  ABD: Soft non tender. Bowel sounds normal.  Ext: No edema  MS: Adequate ROM spine, shoulders, hips and knees.  Skin: Intact, no ulcerations or rash noted.  Psych: Good eye contact, normal affect. Memory intact not anxious or depressed appearing.  CNS: CN 2-12 intact, power, tone and sensation normal throughout.       Assessment & Plan:

## 2012-04-25 LAB — MICROALBUMIN / CREATININE URINE RATIO
Creatinine, Urine: 54.7 mg/dL
Microalb Creat Ratio: 24.5 mg/g (ref 0.0–30.0)
Microalb, Ur: 1.34 mg/dL (ref 0.00–1.89)

## 2012-04-30 ENCOUNTER — Encounter (HOSPITAL_COMMUNITY): Payer: Medicare Other | Attending: Oncology

## 2012-04-30 DIAGNOSIS — D509 Iron deficiency anemia, unspecified: Secondary | ICD-10-CM | POA: Insufficient documentation

## 2012-04-30 LAB — IRON AND TIBC
Iron: 43 ug/dL (ref 42–135)
UIBC: 196 ug/dL (ref 125–400)

## 2012-04-30 LAB — CBC
Platelets: 437 10*3/uL — ABNORMAL HIGH (ref 150–400)
RDW: 19.5 % — ABNORMAL HIGH (ref 11.5–15.5)
WBC: 8.1 10*3/uL (ref 4.0–10.5)

## 2012-04-30 NOTE — Assessment & Plan Note (Signed)
Controlled, no change in medication DASH diet and commitment to daily physical activity for a minimum of 30 minutes discussed and encouraged, as a part of hypertension management. The importance of attaining a healthy weight is also discussed.  

## 2012-04-30 NOTE — Assessment & Plan Note (Signed)
Stable treated by rheumatology

## 2012-04-30 NOTE — Assessment & Plan Note (Signed)
Continue fosamax. Due to excessive steroid exposure

## 2012-04-30 NOTE — Progress Notes (Signed)
Labs drawn today for cbc,ferr,Iron and IBC 

## 2012-04-30 NOTE — Assessment & Plan Note (Signed)
Log shows improved blood sugars, pt to get hBa1C before next visit Patient advised to reduce carb and sweets, commit to regular physical activity, take meds as prescribed, test blood as directed, and attempt to lose weight, to improve blood sugar control.

## 2012-05-06 ENCOUNTER — Encounter (HOSPITAL_COMMUNITY): Payer: Self-pay | Admitting: Oncology

## 2012-05-06 ENCOUNTER — Encounter (HOSPITAL_BASED_OUTPATIENT_CLINIC_OR_DEPARTMENT_OTHER): Payer: Medicare Other | Admitting: Oncology

## 2012-05-06 VITALS — BP 137/85 | HR 121 | Temp 99.8°F | Resp 18 | Wt 148.4 lb

## 2012-05-06 DIAGNOSIS — D509 Iron deficiency anemia, unspecified: Secondary | ICD-10-CM

## 2012-05-06 DIAGNOSIS — E119 Type 2 diabetes mellitus without complications: Secondary | ICD-10-CM

## 2012-05-06 DIAGNOSIS — D638 Anemia in other chronic diseases classified elsewhere: Secondary | ICD-10-CM

## 2012-05-06 DIAGNOSIS — M069 Rheumatoid arthritis, unspecified: Secondary | ICD-10-CM

## 2012-05-06 NOTE — Patient Instructions (Addendum)
[facility]  Discharge Instructions  RECOMMENDATIONS MADE BY THE CONSULTANT AND ANY TEST RESULTS WILL BE SENT TO YOUR REFERRING DOCTOR.   EXAM FINDINGS BY MD TODAY AND SIGNS AND SYMPTOMS TO REPORT TO CLINIC OR PRIMARY MD: Discussion by PA.  Your blood work is much improved.  We will recheck labs in 3 months and see you after.  You do not need to take any oral iron.   MEDICATIONS PRESCRIBED: none   INSTRUCTIONS GIVEN AND DISCUSSED: Other :  Report increased fatigue, shortness of breath, increase ice intake, etc.  SPECIAL INSTRUCTIONS/FOLLOW-UP: Return to Clinic as scheduled.   I acknowledge that I have been informed and understand all the instructions given to me and received a copy. I do not have any more questions at this time, but understand that I may call the Specialty Clinic at [facility] at [phone] during business hours should I have any further questions or need assistance in obtaining follow-up care.    __________________________________________  _____________  __________ Signature of Patient or Authorized Representative            Date                   Time    __________________________________________ Nurse's Signature

## 2012-05-06 NOTE — Progress Notes (Signed)
Stephanie Overman, MD [address] [place] [address]  1. ANEMIA, IRON DEFICIENCY     CURRENT THERAPY: S/P Feraheme 1020 mg IV on 04/02/2012  INTERVAL HISTORY: Stephanie Mays 61 y.o. female returns for  regular  visit for followup of  Anemia.   She is seen in follow-up after her initial consultation.  Of note, this patient does have rheumatoid arthritis and is on methotrexate/prednisone for this managed by her rheumatologist, Dr. Dareen Piano in [place], [place].  She also has poorly controlled diabetes managed by her PCP.   She is S/P a Feraheme IV infusion of 1020 mg secondary to her iron studies revealing a less than 10 serum iron, not calculated TIBC, but a normal ferritin at 81 (possibly falsely normal) on 03/26/2012.  She received the infusion on 04/02/2012.    Her most recent labs reveal an anemia of chronic disease with a low TIBC and low percent saturation at 239 and 18% respectively.  Her Hgb is WNL at 12.0 with a normal MCV.  She has a minimal thrombocytosis which is unimpressive at this time.   Patient's only complaint is her joints paining her due to the rheumatoid arthritis, which is exacerbated by the moist weather lately.  She is still on her scheduled methotrexate.  Otherwise, she denies any complaints.  She denies any blood in stool, black tarry stool, hematuria, vaginal bleeding, hemoptysis.  Complete ROS questioning is negative.   Past Medical History  Diagnosis Date  . Anemia   . Diabetes mellitus   . Hypertension   . Hyperlipidemia   . Diabetic nephropathy   . Arthritis 1999    rheumatoid  . RA (rheumatoid arthritis)     has DIABETES MELLITUS, TYPE II, UNCONTROLLED; DIABETIC PERIPHERAL NEUROPATHY; ANEMIA, IRON DEFICIENCY; CERUMEN IMPACTION, BILATERAL; HYPERTENSION; ARTHRITIS, RHEUMATOID; OSTEOPENIA; and Hernia of abdominal wall on her problem list.      has no known allergies.  Stephanie Mays does not currently have medications on file.  Past Surgical  History  Procedure Date  . Abdominal hysterectomy   . Colonoscopy 06/27/2011    Procedure: COLONOSCOPY;  Surgeon: Malissa Hippo, MD;  Location: [facility];  Service: Endoscopy;  Laterality: N/A;  7:30    Denies any headaches, dizziness, double vision, fevers, chills, night sweats, nausea, vomiting, diarrhea, constipation, chest pain, heart palpitations, shortness of breath, blood in stool, black tarry stool, urinary pain, urinary burning, urinary frequency, hematuria.   PHYSICAL EXAMINATION  ECOG PERFORMANCE STATUS: 1 - Symptomatic but completely ambulatory  Filed Vitals:   05/06/12 1300  BP: 137/85  Pulse: 121  Temp: 99.8 F (37.7 C)  Resp: 18    GENERAL:alert, no distress, well nourished, well developed, comfortable, cooperative, obese and smiling SKIN: skin color, texture, turgor are normal, no rashes or significant lesions HEAD: Normocephalic, No masses, lesions, tenderness or abnormalities EYES: normal, Conjunctiva are pink and non-injected EARS: External ears normal OROPHARYNX:lips, buccal mucosa, and tongue normal and mucous membranes are moist  NECK: supple, trachea midline LYMPH:  not examined BREAST:not examined LUNGS: clear to auscultation and percussion HEART: regular rate & rhythm, no murmurs, no gallops, S1 normal and S2 normal ABDOMEN:abdomen soft, non-tender, obese and normal bowel sounds BACK: Back symmetric, no curvature. EXTREMITIES:less then 2 second capillary refill, no joint deformities, effusion, or inflammation, no skin discoloration, no clubbing, no cyanosis, positive findings:  Heberden's nodes present on DIP's of hands, Bouchard's nodes present, tenderness and swelling of PIP and MCP joints and ulnar deviation  NEURO: alert & oriented x 3 with fluent speech, no focal motor/sensory deficits    LABORATORY DATA: CBC    Component Value Date/Time   WBC 8.1 04/30/2012 1144   RBC 4.37 04/30/2012 1144   HGB 12.0 04/30/2012 1144   HCT 38.5 04/30/2012  1144   PLT 437* 04/30/2012 1144   MCV 88.1 04/30/2012 1144   MCH 27.5 04/30/2012 1144   MCHC 31.2 04/30/2012 1144   RDW 19.5* 04/30/2012 1144   LYMPHSABS 0.7 03/26/2012 1515   MONOABS 1.0 03/26/2012 1515   EOSABS 0.0 03/26/2012 1515   BASOSABS 0.0 03/26/2012 1515    Lab Results  Component Value Date   IRON 43 04/30/2012   TIBC 239* 04/30/2012   FERRITIN 1167* 04/30/2012      ASSESSMENT:  1. Anemia of chronic disease with a low TIBC and low % saturation. 2. Possible element of iron deficiency anemia with low serum iron less than 10 and non-calculable TIBC.  S/P Feraheme 1020 mg IV on 04/02/2012. 3. Anemia, treatment of #4 contributing to anemia with Methotrexate as directed by Rheumatologist. 4. Rheumatoid arthritis, on methotrexate.  Managed by Dr. Dareen Piano (Rheumatologist) in [place]. 5. Poorly controlled DM with Hgb A1C on 03/04/2012 of 8.1 6. HTN 7. Intolerance to PO iron tablets due to constipation, nausea.   PLAN:  1. I personally reviewed and went over laboratory results with the patient. 2. Lab work in 3 months: CBC diff, CMET, Iron studies 3. Continue to refrain from ferrous sulfate PO tablets due to intolerance. 4. Return in 3 months for follow-up.  If labs are stable, will space out appointment visits with labs as indicated per lab results.    All questions were answered. The patient knows to call the clinic with any problems, questions or concerns. We can certainly see the patient much sooner if necessary.  The patient and plan discussed with Glenford Peers, MD and he is in agreement with the aforementioned.  Kaspian Muccio

## 2012-06-16 ENCOUNTER — Other Ambulatory Visit: Payer: Self-pay | Admitting: Family Medicine

## 2012-06-25 LAB — LIPID PANEL
HDL: 53 mg/dL (ref >39)
Triglycerides: 150 mg/dL — ABNORMAL HIGH (ref <150)

## 2012-06-25 LAB — COMPLETE METABOLIC PANEL WITH GFR
Alkaline Phosphatase: 125 U/L — ABNORMAL HIGH (ref 39–117)
CO2: 23 mEq/L (ref 19–32)
Creat: 0.69 mg/dL (ref 0.50–1.10)
GFR, Est African American: >89 mL/min
GFR, Est Non African American: >89 mL/min
Glucose, Bld: 115 mg/dL — ABNORMAL HIGH (ref 70–99)
Total Bilirubin: 0.3 mg/dL (ref 0.3–1.2)

## 2012-06-26 LAB — VITAMIN D 25 HYDROXY (VIT D DEFICIENCY, FRACTURES): Vit D, 25-Hydroxy: 61 ng/mL (ref 30–89)

## 2012-06-28 ENCOUNTER — Other Ambulatory Visit: Payer: Self-pay | Admitting: Family Medicine

## 2012-07-02 ENCOUNTER — Encounter: Payer: Self-pay | Admitting: Family Medicine

## 2012-07-02 ENCOUNTER — Ambulatory Visit (INDEPENDENT_AMBULATORY_CARE_PROVIDER_SITE_OTHER): Payer: Medicare Other | Admitting: Family Medicine

## 2012-07-02 VITALS — BP 130/80 | HR 112 | Resp 18 | Ht 62.5 in | Wt 146.0 lb

## 2012-07-02 DIAGNOSIS — Z0000 Encounter for general adult medical examination without abnormal findings: Secondary | ICD-10-CM

## 2012-07-02 DIAGNOSIS — IMO0001 Reserved for inherently not codable concepts without codable children: Secondary | ICD-10-CM

## 2012-07-02 MED ORDER — INSULIN GLARGINE 100 UNIT/ML ~~LOC~~ SOLN: 7.0000 [IU] | Freq: Every day | SUBCUTANEOUS | Status: DC

## 2012-07-02 NOTE — Progress Notes (Signed)
Subjective:    Patient ID: Stephanie Mays, female    DOB: [birth year], 61 y.o.   MRN: [id number]  HPI  Preventive Screening-Counseling & Management   Patient present here today for a Medicare annual wellness visit.   Current Problems (verified)   Medications Prior to Visit Allergies (verified)   PAST HISTORY  Family History: 1 brother died of colon cancer age 60, 2 brothers living no medical illness, 4 sisters all living, 1 sister died of hodgkins at age 10 Social History Single no children, disabled in 2007 due to rheumatoid arthritis, was in [employer], never nicotine or alcohol or illicit drug use   Risk Factors  Current exercise habits:  inadequate less than 2 days per week, trying to get exercise bike Dietary issues discussed:low carb , low sugar    Cardiac risk factors:diabetes    Depression Screen  (Note: if answer to either of the following is "Yes", a more complete depression screening is indicated)   Over the past two weeks, have you felt down, depressed or hopeless? No  Over the past two weeks, have you felt little interest or pleasure in doing things? No  Have you lost interest or pleasure in daily life? No  Do you often feel hopeless? No  Do you cry easily over simple problems? No   Activities of Daily Living  In your present state of health, do you have any difficulty performing the following activities?  Driving?: No Managing money?: No Feeding yourself?:No Getting from bed to chair?:No Climbing a flight of stairs?:No Preparing food and eating?:limitation in hands Bathing or showering?:No Getting dressed?:No Getting to the toilet?:No Using the toilet?:No Moving around from place to place?: No  Fall Risk Assessment In the past year have you fallen or had a near fall?:No Are you currently taking any medications that make you dizziness?:No   Hearing Difficulties: No Do you often ask people to speak up or repeat themselves?:No Do you experience  ringing or noises in your ears?:No Do you have difficulty understanding soft or whispered voices?:No  Cognitive Testing  Alert? Yes Normal Appearance?Yes  Oriented to person? Yes Place? Yes  Time? Yes  Displays appropriate judgment?Yes  Can read the correct time from a watch face? yes Are you having problems remembering things?No  Advanced Directives have been discussed with the patient?Yes    List the Names of Other Physician/Practitioners you currently use: Dr Dareen Piano, Dr Karilyn Cota, Dr Harvie Bridge    Assessment:    Annual Wellness Exam   Plan:    During the course of the visit the patient was educated and counseled about appropriate screening and preventive services including:  A healthy diet is rich in fruit, vegetables and whole grains. Poultry fish, nuts and beans are a healthy choice for protein rather then red meat. A low sodium diet and drinking 64 ounces of water daily is generally recommended. Oils and sweet should be limited. Carbohydrates especially for those who are diabetic or overweight, should be limited to 30-45 gram per meal. It is important to eat on a regular schedule, at least 3 times daily. Snacks should be primarily fruits, vegetables or nuts. It is important that you exercise regularly at least 30 minutes 5 times a week. If you develop chest pain, have severe difficulty breathing, or feel very tired, stop exercising immediately and seek medical attention  Immunization reviewed and updated. Cancer screening reviewed and updated    Patient Instructions (the written plan) was given to the patient.  Medicare Attestation  I have personally reviewed:  The patient's medical and social history  Their use of alcohol, tobacco or illicit drugs  Their current medications and supplements  The patient's functional ability including ADLs,fall risks, home safety risks, cognitive, and hearing and visual impairment  Diet and physical activities  Evidence for depression or mood  disorders  The patient's weight, height, BMI, and visual acuity have been recorded in the chart. I have made referrals, counseling, and provided education to the patient based on review of the above and I have provided the patient with a written personalized care plan for preventive services.     Review of Systems     Objective:   Physical Exam        Assessment & Plan:

## 2012-07-02 NOTE — Patient Instructions (Addendum)
F/u in 3.5 month, please call if you need me before  You need to start lantus 7 units every morning, continue other medication for blood sugar this is still too high.   Please try to start daily physical activity for at least 30 minutes every day.    No other changes in medication  Check sugar twice daily Goal for fasting blood sugar ranges from 80 to 120 and 2 hours after any meal or at bedtime should be between 130 to 170.  Non fasting HBa1C and cmp and EGFR in 3.5 month

## 2012-07-05 MED ORDER — FOLIC ACID 1 MG PO TABS: 1.0000 mg | ORAL_TABLET | Freq: Every day | ORAL | Status: DC

## 2012-07-05 MED ORDER — GLIPIZIDE 10 MG PO TABS: 10.0000 mg | ORAL_TABLET | Freq: Two times a day (BID) | ORAL | Status: DC

## 2012-07-14 NOTE — Assessment & Plan Note (Signed)
Pt's annual wellness exam is as documented. Despite severe limitation due to rheumatoid disease, she is able to live independently. She has been reminded of the need to work consistently to improve and normalize her blood sugars which unfortunately are uncontrolled. She has limitation in finger dexterity as far as insulin admin is concerned bu reports sufficient support t help with this

## 2012-07-29 ENCOUNTER — Encounter (HOSPITAL_COMMUNITY): Payer: Medicare Other | Attending: Oncology

## 2012-07-29 DIAGNOSIS — D509 Iron deficiency anemia, unspecified: Secondary | ICD-10-CM | POA: Insufficient documentation

## 2012-07-29 LAB — IRON AND TIBC
Iron: 37 ug/dL — ABNORMAL LOW (ref 42–135)
TIBC: 255 ug/dL (ref 250–470)

## 2012-07-29 LAB — CBC WITH DIFFERENTIAL/PLATELET
Basophils Absolute: 0 10*3/uL (ref 0.0–0.1)
Basophils Relative: 0 % (ref 0–1)
Eosinophils Absolute: 0.1 10*3/uL (ref 0.0–0.7)
MCH: 29.3 pg (ref 26.0–34.0)
MCHC: 31.6 g/dL (ref 30.0–36.0)
Neutrophils Relative %: 72 % (ref 43–77)
Platelets: 379 10*3/uL (ref 150–400)

## 2012-07-29 NOTE — Progress Notes (Signed)
Labs drawn today for cbc/diff,Iron and IBC,ferr 

## 2012-07-30 ENCOUNTER — Encounter (HOSPITAL_COMMUNITY): Payer: Self-pay | Admitting: Oncology

## 2012-07-30 ENCOUNTER — Encounter (HOSPITAL_BASED_OUTPATIENT_CLINIC_OR_DEPARTMENT_OTHER): Payer: Medicare Other | Admitting: Oncology

## 2012-07-30 VITALS — BP 117/77 | HR 91 | Temp 98.9°F | Resp 18 | Wt 148.2 lb

## 2012-07-30 DIAGNOSIS — D649 Anemia, unspecified: Secondary | ICD-10-CM

## 2012-07-30 DIAGNOSIS — D509 Iron deficiency anemia, unspecified: Secondary | ICD-10-CM

## 2012-07-30 NOTE — Patient Instructions (Signed)
[facility] Discharge Instructions  RECOMMENDATIONS MADE BY THE CONSULTANT AND ANY TEST RESULTS WILL BE SENT TO YOUR REFERRING PHYSICIAN.  EXAM FINDINGS BY THE PHYSICIAN TODAY AND SIGNS OR SYMPTOMS TO REPORT TO CLINIC OR PRIMARY PHYSICIAN: Exam findings as discussed by T. Jacalyn Lefevre, PA-C.  MEDICATIONS PRESCRIBED:  n/a  INSTRUCTIONS GIVEN AND DISCUSSED: n/a  SPECIAL INSTRUCTIONS/FOLLOW-UP: 1.  You've been scheduled for labs in 3 and 6 months; return after labs in 6 months to see T. Kefalas, PA-C. 2.  Continue to follow-up with your PCP for arthritis pain and pain management.  Thank you for choosing [facility] to provide your oncology and hematology care.  To afford each patient quality time with our providers, please arrive at least 15 minutes before your scheduled appointment time.  With your help, our goal is to use those 15 minutes to complete the necessary work-up to ensure our physicians have the information they need to help with your evaluation and healthcare recommendations.    Effective January 1st, 2014, we ask that you re-schedule your appointment with our physicians should you arrive 10 or more minutes late for your appointment.  We strive to give you quality time with our providers, and arriving late affects you and other patients whose appointments are after yours.    Again, thank you for choosing [facility].  Our hope is that these requests will decrease the amount of time that you wait before being seen by our physicians.       _____________________________________________________________  I acknowledge that I have been informed and understand all the instructions given to me and received a copy. I do not have anymore questions at this time but understand that I may call the Cancer Center at [facility] at [phone] during business hours should I have any further questions or need assistance in obtaining follow-up  care.

## 2012-07-30 NOTE — Progress Notes (Signed)
[name], MD [address] [place] [address]  1. ANEMIA, IRON DEFICIENCY  CBC with Differential, Iron and TIBC, Ferritin    CURRENT THERAPY:S/P Feraheme 1020 mg IV on 04/02/2012   INTERVAL HISTORY: Stephanie Mays 62 y.o. female returns for  regular  visit for followup of iron deficiency anemia and anemia of chronic disease with an element of rheumatoid arthritis treatment contributing to anemia.   Of note, this patient does have rheumatoid arthritis and is on methotrexate/prednisone for this managed by her rheumatologist, Dr. Dareen Piano in [place], [place]. Despite her debilitating RA, she still lives independently.  She also has poorly controlled diabetes managed by her PCP.  I personally reviewed and went over laboratory results with the patient.   Labs show a normal Hgb  At 12 with a normal platelet count at 379,000.  She has a mildly elevated WBC count at 12.6 but she has this intermittently.   Her iron studies show a low serum iron at 37 with an elevated ferritin at 616 (possibly falsely elevated in light of her RA), and a normal TIBC at 255.  Hgb A1c was 8.1 on 06/25/2012.  With this information, her Hgb is WNL.  We will continue to monitor her blood work and iron stores.  She denies any complaints.  Complete ROS questioning is negative.  She continues to follow-up with PC and Dr. Dareen Piano.    Past Medical History  Diagnosis Date  . Anemia   . Diabetes mellitus   . Hypertension   . Hyperlipidemia   . Arthritis 1999    rheumatoid  . RA (rheumatoid arthritis)   . Neuromuscular disorder     has Diabetes mellitus, insulin dependent (IDDM), uncontrolled; DIABETIC PERIPHERAL NEUROPATHY; ANEMIA, IRON DEFICIENCY; HYPERTENSION; ARTHRITIS, RHEUMATOID; OSTEOPENIA; Hernia of abdominal wall; and Routine general medical examination at a health care facility on her problem list.      has no known allergies.  Ms. Wootan does not currently have medications on file.  Past  Surgical History  Procedure Date  . Abdominal hysterectomy   . Colonoscopy 06/27/2011    Procedure: COLONOSCOPY;  Surgeon: Malissa Hippo, MD;  Location: [facility];  Service: Endoscopy;  Laterality: N/A;  7:30    Denies any headaches, dizziness, double vision, fevers, chills, night sweats, nausea, vomiting, diarrhea, constipation, chest pain, heart palpitations, shortness of breath, blood in stool, black tarry stool, urinary pain, urinary burning, urinary frequency, hematuria.   PHYSICAL EXAMINATION  ECOG PERFORMANCE STATUS: 2 - Symptomatic, <50% confined to bed  Filed Vitals:   07/30/12 1021  BP: 117/77  Pulse: 91  Temp: 98.9 F (37.2 C)  Resp: 18    GENERAL:alert, no distress, well nourished, well developed, comfortable, cooperative and smiling SKIN: skin color, texture, turgor are normal, no rashes or significant lesions HEAD: Normocephalic, No masses, lesions, tenderness or abnormalities EYES: Conjunctiva are pink and non-injected EARS: External ears normal OROPHARYNX:mucous membranes are moist  NECK: supple, no adenopathy, trachea midline LYMPH:  no palpable lymphadenopathy BREAST:not examined LUNGS: clear to auscultation  HEART: regular rate & rhythm, no murmurs, no gallops, S1 normal and S2 normal ABDOMEN:abdomen soft, non-tender, obese and normal bowel sounds BACK: Back symmetric, no curvature. EXTREMITIES:less then 2 second capillary refill, no joint deformities, effusion, or inflammation, no skin discoloration, no clubbing, no cyanosis, positive findings: Heberden's nodes present on DIP's of hands, Bouchard's nodes present, tenderness and swelling of PIP and MCP joints and ulnar deviation   NEURO: alert & oriented  x 3 with fluent speech, no focal motor/sensory deficits, gait normal   LABORATORY DATA: CBC    Component Value Date/Time   WBC 12.6* 07/29/2012 1100   RBC 4.09 07/29/2012 1100   HGB 12.0 07/29/2012 1100   HCT 38.0 07/29/2012 1100   PLT 379 07/29/2012 1100     MCV 92.9 07/29/2012 1100   MCH 29.3 07/29/2012 1100   MCHC 31.6 07/29/2012 1100   RDW 14.7 07/29/2012 1100   LYMPHSABS 2.0 07/29/2012 1100   MONOABS 1.4* 07/29/2012 1100   EOSABS 0.1 07/29/2012 1100   BASOSABS 0.0 07/29/2012 1100    Lab Results  Component Value Date   IRON 37* 07/29/2012   TIBC 255 07/29/2012   FERRITIN 616* 07/29/2012      ASSESSMENT:  1. Anemia of chronic disease with a low TIBC and low % saturation.  2. Definite element of iron deficiency anemia with low serum iron less than 10 and non-calculable TIBC. S/P Feraheme 1020 mg IV on 04/02/2012 with great response.  3. Anemia, secondary to #1 and 2 in addition to treatment of #4 contributing to anemia with Methotrexate as directed by Rheumatologist.  4. Rheumatoid arthritis, on methotrexate. Managed by Dr. Dareen Piano (Rheumatologist) in [place].  5. Poorly controlled DM with Hgb A1C on 03/04/2012 of 8.1  6. HTN  7. Intolerance to PO iron tablets due to constipation, nausea.   PLAN:  1. I personally reviewed and went over laboratory results with the patient. 2. Labs in 3 months and 6 months: CBC, iron/TIBC, ferritin 3. Continue to refrain from ferrous sulfate PO tablets due to intolerance. 4. Continue RA treatment per rheumatologist 5. Continue DM control per PCP 6. Return in 6 months for follow-up.    All questions were answered. The patient knows to call the clinic with any problems, questions or concerns. We can certainly see the patient much sooner if necessary.  The patient and plan discussed with Glenford Peers, MD and he is in agreement with the aforementioned.  Stephanie Mays

## 2012-08-12 ENCOUNTER — Other Ambulatory Visit: Payer: Self-pay

## 2012-08-12 MED ORDER — AMLODIPINE BESYLATE-VALSARTAN 10-320 MG PO TABS: ORAL_TABLET | ORAL | Status: DC

## 2012-09-09 ENCOUNTER — Other Ambulatory Visit: Payer: Self-pay | Admitting: Family Medicine

## 2012-10-16 LAB — COMPLETE METABOLIC PANEL WITH GFR
ALT: 36 U/L — ABNORMAL HIGH (ref 0–35)
Albumin: 4.3 g/dL (ref 3.5–5.2)
CO2: 27 mEq/L (ref 19–32)
Calcium: 10.5 mg/dL (ref 8.4–10.5)
Chloride: 98 mEq/L (ref 96–112)
GFR, Est African American: >89 mL/min
GFR, Est Non African American: >89 mL/min
Glucose, Bld: 201 mg/dL — ABNORMAL HIGH (ref 70–99)
Sodium: 137 mEq/L (ref 135–145)
Total Bilirubin: 0.4 mg/dL (ref 0.3–1.2)
Total Protein: 7.1 g/dL (ref 6.0–8.3)

## 2012-10-16 LAB — HEMOGLOBIN A1C: Hgb A1c MFr Bld: 8.1 % — ABNORMAL HIGH (ref <5.7)

## 2012-10-21 ENCOUNTER — Ambulatory Visit (INDEPENDENT_AMBULATORY_CARE_PROVIDER_SITE_OTHER): Payer: Medicare Other | Admitting: Family Medicine

## 2012-10-21 ENCOUNTER — Encounter: Payer: Self-pay | Admitting: Family Medicine

## 2012-10-21 VITALS — BP 130/78 | HR 100 | Resp 18 | Ht 62.5 in | Wt 144.1 lb

## 2012-10-21 DIAGNOSIS — E1065 Type 1 diabetes mellitus with hyperglycemia: Secondary | ICD-10-CM

## 2012-10-21 DIAGNOSIS — M069 Rheumatoid arthritis, unspecified: Secondary | ICD-10-CM

## 2012-10-21 DIAGNOSIS — I10 Essential (primary) hypertension: Secondary | ICD-10-CM

## 2012-10-21 MED ORDER — INSULIN GLARGINE 100 UNIT/ML ~~LOC~~ SOLN: 10.0000 [IU] | Freq: Every day | SUBCUTANEOUS | Status: DC

## 2012-10-21 NOTE — Patient Instructions (Addendum)
F/u in 3.5 month  HBA1C , chem 7 and EGFr in 3.5 month  Congrats on weight loss, and actually starting the insulin which I know is a challenge for you  We will check blood sugar in office to see if meter is correct  Increase lantus to 10 units

## 2012-10-23 ENCOUNTER — Other Ambulatory Visit: Payer: Self-pay

## 2012-10-23 MED ORDER — GLIPIZIDE 10 MG PO TABS: 10.0000 mg | ORAL_TABLET | Freq: Two times a day (BID) | ORAL | Status: DC

## 2012-10-23 MED ORDER — ALENDRONATE SODIUM 70 MG PO TABS: 70.0000 mg | ORAL_TABLET | ORAL | Status: DC

## 2012-10-23 MED ORDER — FOLIC ACID 1 MG PO TABS: 1.0000 mg | ORAL_TABLET | Freq: Every day | ORAL | Status: DC

## 2012-10-23 MED ORDER — HYDROCHLOROTHIAZIDE 25 MG PO TABS: 25.0000 mg | ORAL_TABLET | Freq: Every day | ORAL | Status: DC

## 2012-10-23 MED ORDER — SAXAGLIPTIN-METFORMIN ER 2.5-1000 MG PO TB24: ORAL_TABLET | ORAL | Status: DC

## 2012-10-23 MED ORDER — METOPROLOL SUCCINATE ER 100 MG PO TB24: ORAL_TABLET | ORAL | Status: DC

## 2012-10-28 ENCOUNTER — Other Ambulatory Visit (HOSPITAL_COMMUNITY): Payer: Medicare Other

## 2012-10-31 ENCOUNTER — Encounter (HOSPITAL_COMMUNITY): Payer: Medicare Other | Attending: Oncology

## 2012-10-31 DIAGNOSIS — D509 Iron deficiency anemia, unspecified: Secondary | ICD-10-CM

## 2012-10-31 LAB — CBC WITH DIFFERENTIAL/PLATELET
Eosinophils Absolute: 0.1 10*3/uL (ref 0.0–0.7)
Lymphocytes Relative: 22 % (ref 12–46)
Lymphs Abs: 1.7 10*3/uL (ref 0.7–4.0)
MCH: 30 pg (ref 26.0–34.0)
Neutro Abs: 5.4 10*3/uL (ref 1.7–7.7)
Neutrophils Relative %: 70 % (ref 43–77)
Platelets: 358 10*3/uL (ref 150–400)
RBC: 4.26 MIL/uL (ref 3.87–5.11)
WBC: 7.8 10*3/uL (ref 4.0–10.5)

## 2012-10-31 NOTE — Progress Notes (Signed)
Labs drawn today for cbc/diff,Iron and IBC

## 2012-11-01 LAB — IRON AND TIBC: Iron: 53 ug/dL (ref 42–135)

## 2012-11-03 NOTE — Assessment & Plan Note (Signed)
Unchanged, increase dose of lantus, with close follow up Patient advised to reduce carb and sweets, commit to regular physical activity, take meds as prescribed, test blood as directed, and attempt to lose weight, to improve blood sugar control.

## 2012-11-03 NOTE — Progress Notes (Signed)
  Subjective:    Patient ID: Stephanie Mays, female    DOB: 1950-08-12, 62 y.o.   MRN: [id number]  HPI The PT is here for follow up and re-evaluation of chronic medical conditions, medication management and review of any available recent lab and radiology data.  Preventive health is updated, specifically  Cancer screening and Immunization.   Questions or concerns regarding consultations or procedures which the PT has had in the interim are  Addressed.Has seen both rheumatology and hematology since last visit, no changes in management made The PT denies any adverse reactions to current medications since the last visit. Has been self administering , which is a challenge due to rheumatoid arthritis, she does get help from family There are no new concerns.  There are no specific complaints       Review of Systems See HPI Denies recent fever or chills. Denies sinus pressure, nasal congestion, ear pain or sore throat. Denies chest congestion, productive cough or wheezing. Denies chest pains, palpitations and leg swelling Denies abdominal pain, nausea, vomiting,diarrhea or constipation.   Denies dysuria, frequency, hesitancy or incontinence. Chronic  joint pain, swelling and limitation in mobility. Denies headaches, seizures, numbness, or tingling. Denies depression, anxiety or insomnia. Denies skin break down or rash.        Objective:   Physical Exam  Patient alert and oriented and in no cardiopulmonary distress.  HEENT: No facial asymmetry, EOMI, no sinus tenderness,  oropharynx pink and moist.  Neck supple no adenopathy.  Chest: Clear to auscultation bilaterally.  CVS: S1, S2 no murmurs, no S3.  ABD: Soft non tender. Bowel sounds normal.  Ext: No edema  MS: Adequate ROM spine, shoulders, hips and knees.  Skin: Intact, no ulcerations or rash noted.  Psych: Good eye contact, normal affect. Memory intact not anxious or depressed appearing.  CNS: CN 2-12 intact, power,  tone and sensation normal throughout.       Assessment & Plan:

## 2012-11-03 NOTE — Assessment & Plan Note (Signed)
Controlled, no change in medication DASH diet and commitment to daily physical activity for a minimum of 30 minutes discussed and encouraged, as a part of hypertension management. The importance of attaining a healthy weight is also discussed.  

## 2012-11-03 NOTE — Assessment & Plan Note (Signed)
Stable,maintained on prednisone and followed by rheumatology

## 2012-11-25 ENCOUNTER — Other Ambulatory Visit: Payer: Self-pay | Admitting: Family Medicine

## 2012-12-05 ENCOUNTER — Other Ambulatory Visit: Payer: Self-pay | Admitting: Family Medicine

## 2013-01-27 ENCOUNTER — Encounter (HOSPITAL_COMMUNITY): Payer: Medicare Other | Attending: Oncology

## 2013-01-27 DIAGNOSIS — D509 Iron deficiency anemia, unspecified: Secondary | ICD-10-CM | POA: Insufficient documentation

## 2013-01-27 LAB — CBC WITH DIFFERENTIAL/PLATELET
Basophils Relative: 0 % (ref 0–1)
Eosinophils Absolute: 0.1 10*3/uL (ref 0.0–0.7)
Eosinophils Relative: 1 % (ref 0–5)
HCT: 40.8 % (ref 36.0–46.0)
Hemoglobin: 13.3 g/dL (ref 12.0–15.0)
Lymphs Abs: 2.1 10*3/uL (ref 0.7–4.0)
MCH: 31.1 pg (ref 26.0–34.0)
MCHC: 32.6 g/dL (ref 30.0–36.0)
MCV: 95.3 fL (ref 78.0–100.0)
Monocytes Absolute: 0.9 10*3/uL (ref 0.1–1.0)
Monocytes Relative: 11 % (ref 3–12)
Neutrophils Relative %: 63 % (ref 43–77)
RBC: 4.28 MIL/uL (ref 3.87–5.11)

## 2013-01-27 LAB — IRON AND TIBC
Iron: 88 ug/dL (ref 42–135)
Saturation Ratios: 33 % (ref 20–55)
TIBC: 263 ug/dL (ref 250–470)
UIBC: 175 ug/dL (ref 125–400)

## 2013-01-27 LAB — FERRITIN: Ferritin: 266 ng/mL (ref 10–291)

## 2013-01-27 NOTE — Progress Notes (Signed)
Labs drawn today for cbc/diff,ferr,Iron and IBC 

## 2013-01-27 NOTE — Progress Notes (Signed)
[name], MD [address] [place] [address]  ANEMIA, IRON DEFICIENCY  CURRENT THERAPY:S/P Feraheme 1020 mg IV on 04/02/2012   INTERVAL HISTORY: Stephanie Mays 62 y.o. female returns for  regular  visit for followup of iron deficiency anemia and anemia of chronic disease with an element of rheumatoid arthritis treatment contributing to anemia.   I personally reviewed and went over laboratory results with the patient.  Labs are stable with a Hgb of 13.3, WBC 8.6, and platelet count of 313.  Iron studies are stable as well with a TIBC of 263, Sat 33, and serum iron of 88. Ferritin is 266 compared to 331 on 10/31/2012.  She remains on methotrexate and prednisone for her rheumatoid arthritis. This is managed by her rheumatologist.    Rheumatoid arthritis is doing well with the warmer weather. She reports that when his cold, rheumatoid arthritis really gives her issues with pain. She does take nonsteroidal anti-inflammatory for the discomfort.  She denies any blood in her stools or black tarry stools. She denies any blood loss that she is able to grossly identified.  I spent some time giving the patient education regarding iron deficiency anemia and we reviewed all of her laboratory work as mentioned above. A provide her education regarding ferritin, TIBC, percent saturation, and serum iron. Her ferritin is slowly declining and I suspect within the next 6-9 months she'll require another IV iron infusion.  Hematologically, the patient denies any complaints of ROS questioning is negative.    Past Medical History  Diagnosis Date  . Anemia   . Diabetes mellitus   . Hypertension   . Hyperlipidemia   . Arthritis 1999    rheumatoid  . RA (rheumatoid arthritis)   . Neuromuscular disorder     has Diabetes mellitus, insulin dependent (IDDM), uncontrolled; DIABETIC PERIPHERAL NEUROPATHY; ANEMIA, IRON DEFICIENCY; HYPERTENSION; ARTHRITIS, RHEUMATOID; OSTEOPENIA; Hernia  of abdominal wall; and Routine general medical examination at a health care facility on her problem list.     has No Known Allergies.  Ms. Butkiewicz does not currently have medications on file.  Past Surgical History  Procedure Laterality Date  . Abdominal hysterectomy    . Colonoscopy  06/27/2011    Procedure: COLONOSCOPY;  Surgeon: Malissa Hippo, MD;  [facility];  Service: Endoscopy;  Laterality: N/A;  7:30    Denies any headaches, dizziness, double vision, fevers, chills, night sweats, nausea, vomiting, diarrhea, constipation, chest pain, heart palpitations, shortness of breath, blood in stool, black tarry stool, urinary pain, urinary burning, urinary frequency, hematuria.   PHYSICAL EXAMINATION  ECOG PERFORMANCE STATUS: 2 - Symptomatic, <50% confined to bed  Filed Vitals:   01/28/13 1158  BP: 132/84  Pulse: 98  Temp: 99.2 F (37.3 C)  Resp: 18    GENERAL:alert, no distress, well nourished, well developed, comfortable, cooperative and smiling SKIN: skin color, texture, turgor are normal, no rashes or significant lesions HEAD: Normocephalic, No masses, lesions, tenderness or abnormalities EYES: normal, PERRLA, EOMI, Conjunctiva are pink and non-injected EARS: External ears normal OROPHARYNX:mucous membranes are moist  NECK: supple, no adenopathy, thyroid normal size, non-tender, without nodularity, no stridor, non-tender, trachea midline LYMPH:  not examined BREAST:not examined LUNGS: clear to auscultation and percussion HEART: regular rate & rhythm, no murmurs, no gallops, S1 normal and S2 normal ABDOMEN:abdomen soft, non-tender and normal bowel sounds BACK: Back symmetric, no curvature. EXTREMITIES:positive findings:  Heberden's nodes present on DIP's of hands, Bouchard's nodes present,  tenderness and swelling of PIP and MCP joints and ulnar deviation  NEURO: alert & oriented x 3 with fluent speech, no focal motor/sensory deficits, gait normal   LABORATORY  DATA: CBC    Component Value Date/Time   WBC 8.6 01/27/2013 1002   RBC 4.28 01/27/2013 1002   HGB 13.3 01/27/2013 1002   HCT 40.8 01/27/2013 1002   PLT 313 01/27/2013 1002   MCV 95.3 01/27/2013 1002   MCH 31.1 01/27/2013 1002   MCHC 32.6 01/27/2013 1002   RDW 14.2 01/27/2013 1002   LYMPHSABS 2.1 01/27/2013 1002   MONOABS 0.9 01/27/2013 1002   EOSABS 0.1 01/27/2013 1002   BASOSABS 0.0 01/27/2013 1002    Lab Results  Component Value Date   IRON 88 01/27/2013   TIBC 263 01/27/2013   FERRITIN 266 01/27/2013     RADIOGRAPHIC STUDIES:  03/15/2013  *RADIOLOGY REPORT*  Clinical Data: Screening.  DIGITAL BILATERAL SCREENING MAMMOGRAM WITH CAD  Comparison: Previous exams.  Findings: There are scattered fibroglandular densities. No  suspicious masses, architectural distortion, or calcifications are  present.  Images were processed with CAD.  IMPRESSION:  No mammographic evidence of malignancy.  A result letter of this screening mammogram will be mailed directly  to the patient.  RECOMMENDATION:  Screening mammogram in one year. (Code:SM-B-01Y)  BI-RADS CATEGORY 1: Negative.  Original Report Authenticated By: Darrol Angel, M.D.    ASSESSMENT:  1. Anemia of chronic disease with a low-normal TIBC and low % saturation.  2. Definite element of iron deficiency anemia with low serum iron less than 10 and non-calculable TIBC in the past. S/P Feraheme 1020 mg IV on 04/02/2012 with great response.  3. Anemia, secondary to #1 and 2 in addition to treatment of #4 contributing to anemia with Methotrexate as directed by Rheumatologist.  4. Rheumatoid arthritis, on methotrexate. Managed by Dr. Dareen Piano (Rheumatologist) in [place].  5. Poorly controlled DM with Hgb A1C on 03/04/2012 of 8.1  6. HTN  7. Intolerance to PO iron tablets due to constipation, nausea.  Patient Active Problem List   Diagnosis Date Noted  . Routine general medical examination at a health care facility 07/14/2012  . Hernia of abdominal  wall 05/09/2011  . Diabetes mellitus, insulin dependent (IDDM), uncontrolled 08/13/2008  . DIABETIC PERIPHERAL NEUROPATHY 11/13/2007  . OSTEOPENIA 02/26/2007  . ANEMIA, IRON DEFICIENCY 09/27/2006  . ARTHRITIS, RHEUMATOID 09/27/2006  . HYPERTENSION 07/08/2006     PLAN:  1. I personally reviewed and went over laboratory results with the patient. 2. Labs in 3 months and 6 months: CBC, iron/TIBC, ferritin  3. Continue to refrain from ferrous sulfate PO tablets due to intolerance.  4. Continue RA treatment per rheumatologist  5. Continue DM control per PCP  6. Return in 6 months for follow-up.    THERAPY PLAN:  She has responded well to past IV Feraheme infusions.  We will continue to monitor iron studies and provide IV Feraheme as indicated.   All questions were answered. The patient knows to call the clinic with any problems, questions or concerns. We can certainly see the patient much sooner if necessary.  Patient and plan discussed with Dr. Gerarda Fraction and he is in agreement with the aforementioned.  Sanaii Caporaso

## 2013-01-28 ENCOUNTER — Encounter (HOSPITAL_BASED_OUTPATIENT_CLINIC_OR_DEPARTMENT_OTHER): Payer: Medicare Other | Admitting: Oncology

## 2013-01-28 ENCOUNTER — Encounter (HOSPITAL_COMMUNITY): Payer: Self-pay | Admitting: Oncology

## 2013-01-28 VITALS — BP 132/84 | HR 98 | Temp 99.2°F | Resp 18 | Wt 147.7 lb

## 2013-01-28 DIAGNOSIS — D509 Iron deficiency anemia, unspecified: Secondary | ICD-10-CM

## 2013-01-28 NOTE — Patient Instructions (Addendum)
[facility] Discharge Instructions  RECOMMENDATIONS MADE BY THE CONSULTANT AND ANY TEST RESULTS WILL BE SENT TO YOUR REFERRING PHYSICIAN.  EXAM FINDINGS BY THE PHYSICIAN TODAY AND SIGNS OR SYMPTOMS TO REPORT TO CLINIC OR PRIMARY PHYSICIAN: Exam and discussion by PA.  Your blood work is stable  MEDICATIONS PRESCRIBED:  none  INSTRUCTIONS GIVEN AND DISCUSSED: Report increased fatigue, shortness of breath, increased ice intake, etc.  SPECIAL INSTRUCTIONS/FOLLOW-UP: Blood work in 3 and 6 months and follow-up after labs in 6 months.  Thank you for choosing [facility] to provide your oncology and hematology care.  To afford each patient quality time with our providers, please arrive at least 15 minutes before your scheduled appointment time.  With your help, our goal is to use those 15 minutes to complete the necessary work-up to ensure our physicians have the information they need to help with your evaluation and healthcare recommendations.    Effective January 1st, 2014, we ask that you re-schedule your appointment with our physicians should you arrive 10 or more minutes late for your appointment.  We strive to give you quality time with our providers, and arriving late affects you and other patients whose appointments are after yours.    Again, thank you for choosing [facility].  Our hope is that these requests will decrease the amount of time that you wait before being seen by our physicians.       _____________________________________________________________  Should you have questions after your visit to [facility], please contact our office at [phone] between the hours of 8:30 a.m. and 5:00 p.m.  Voicemails left after 4:30 p.m. will not be returned until the following business day.  For prescription refill requests, have your pharmacy contact our office with your prescription refill request.

## 2013-02-08 ENCOUNTER — Other Ambulatory Visit: Payer: Self-pay | Admitting: Family Medicine

## 2013-02-14 LAB — COMPLETE METABOLIC PANEL WITH GFR
BUN: 12 mg/dL (ref 6–23)
CO2: 28 mEq/L (ref 19–32)
Calcium: 10.3 mg/dL (ref 8.4–10.5)
Chloride: 102 mEq/L (ref 96–112)
Creat: 0.74 mg/dL (ref 0.50–1.10)
GFR, Est African American: >89 mL/min
GFR, Est Non African American: 86 mL/min
Glucose, Bld: 104 mg/dL — ABNORMAL HIGH (ref 70–99)
Total Bilirubin: 0.4 mg/dL (ref 0.3–1.2)

## 2013-02-19 ENCOUNTER — Encounter: Payer: Self-pay | Admitting: Family Medicine

## 2013-02-19 ENCOUNTER — Ambulatory Visit (INDEPENDENT_AMBULATORY_CARE_PROVIDER_SITE_OTHER): Payer: Medicare Other | Admitting: Family Medicine

## 2013-02-19 VITALS — BP 128/76 | HR 93 | Resp 18 | Ht 62.5 in | Wt 146.1 lb

## 2013-02-19 DIAGNOSIS — E1065 Type 1 diabetes mellitus with hyperglycemia: Secondary | ICD-10-CM

## 2013-02-19 DIAGNOSIS — M069 Rheumatoid arthritis, unspecified: Secondary | ICD-10-CM

## 2013-02-19 DIAGNOSIS — K439 Ventral hernia without obstruction or gangrene: Secondary | ICD-10-CM

## 2013-02-19 DIAGNOSIS — I10 Essential (primary) hypertension: Secondary | ICD-10-CM

## 2013-02-19 NOTE — Patient Instructions (Addendum)
F/u with rectal December  6 or after  Call if you need me before  Fasting lipid, cmp and EGFr, hBa1c. TSH microalb Decmber 3 or after  Call in October for flu vaccine  CONGRATS on excellent blood sugar   You are referred to surgeon re hernia for evaluation  Mammogram due August 24 or after please schedule

## 2013-02-19 NOTE — Progress Notes (Signed)
  Subjective:    Patient ID: Stephanie Mays, female    DOB: [birth year], 62 y.o.   MRN: [id number]  HPI  The PT is here for follow up and re-evaluation of chronic medical conditions, medication management and review of any available recent lab and radiology data.  Preventive health is updated, specifically  Cancer screening and Immunization.   Recently was seen by rheumatology as well as by hematology The PT denies any adverse reactions to current medications since the last visit.  Concerned about abdominal hernia, states at times it hurts wants surgical eval concerned about possible incarceration. Denies hypoglycemic episodes m, polyuria or polydipsia   Review of Systems    See HPI Denies recent fever or chills. Denies sinus pressure, nasal congestion, ear pain or sore throat. Denies chest congestion, productive cough or wheezing. Denies chest pains, palpitations and leg swelling Denies  nausea, vomiting,diarrhea or constipation.   Denies dysuria, frequency, hesitancy or incontinence. Chronic  joint pain, swelling and limitation in mobility. Denies headaches, seizures, numbness, or tingling. Denies depression, anxiety or insomnia. Denies skin break down or rash.     Objective:   Physical Exam  Patient alert and oriented and in no cardiopulmonary distress.  HEENT: No facial asymmetry, EOMI, no sinus tenderness,  oropharynx pink and moist.  Neck supple no adenopathy.  Chest: Clear to auscultation bilaterally.  CVS: S1, S2 no murmurs, no S3.  ABD: Soft non tender. Bowel sounds normal.ventral abdominal hernia, reproducible  Ext: No edema  MS: Adequate ROM spine, shoulders, hips and knees.Rheumatoid deformity of fingers  Skin: Intact, no ulcerations or rash noted.  Psych: Good eye contact, normal affect. Memory intact not anxious or depressed appearing.  CNS: CN 2-12 intact, power, tone and sensation normal throughout.       Assessment & Plan:

## 2013-02-22 NOTE — Assessment & Plan Note (Signed)
Marked improvement , pt applauded on this No med change Patient advised to reduce carb and sweets, commit to regular physical activity, take meds as prescribed, test blood as directed, and attempt to lose weight, to improve blood sugar control.

## 2013-02-22 NOTE — Assessment & Plan Note (Signed)
Followed by rheumatology and stable 

## 2013-02-22 NOTE — Assessment & Plan Note (Signed)
C/o intermittent pain withhte hernia, worried about possibility of incarceration, will refer for surgical eval

## 2013-02-22 NOTE — Assessment & Plan Note (Signed)
Controlled, no change in medication DASH diet and commitment to daily physical activity for a minimum of 30 minutes discussed and encouraged, as a part of hypertension management. The importance of attaining a healthy weight is also discussed.  

## 2013-02-24 ENCOUNTER — Other Ambulatory Visit: Payer: Self-pay | Admitting: Family Medicine

## 2013-02-28 ENCOUNTER — Ambulatory Visit (INDEPENDENT_AMBULATORY_CARE_PROVIDER_SITE_OTHER): Payer: Self-pay | Admitting: General Surgery

## 2013-03-09 ENCOUNTER — Other Ambulatory Visit: Payer: Self-pay | Admitting: Family Medicine

## 2013-03-18 ENCOUNTER — Other Ambulatory Visit: Payer: Self-pay | Admitting: Family Medicine

## 2013-03-18 DIAGNOSIS — Z139 Encounter for screening, unspecified: Secondary | ICD-10-CM

## 2013-03-25 ENCOUNTER — Ambulatory Visit (HOSPITAL_COMMUNITY)
Admission: RE | Admit: 2013-03-25 | Discharge: 2013-03-25 | Disposition: A | Discharge status: Home / Self Care | Payer: Medicare Other | Source: Ambulatory Visit | Attending: Family Medicine | Admitting: Family Medicine

## 2013-03-25 DIAGNOSIS — Z139 Encounter for screening, unspecified: Secondary | ICD-10-CM

## 2013-03-25 DIAGNOSIS — Z1231 Encounter for screening mammogram for malignant neoplasm of breast: Secondary | ICD-10-CM | POA: Insufficient documentation

## 2013-03-26 ENCOUNTER — Other Ambulatory Visit: Payer: Self-pay | Admitting: Family Medicine

## 2013-04-08 ENCOUNTER — Other Ambulatory Visit: Payer: Self-pay | Admitting: Family Medicine

## 2013-04-11 ENCOUNTER — Ambulatory Visit (INDEPENDENT_AMBULATORY_CARE_PROVIDER_SITE_OTHER): Payer: Medicare Other | Admitting: General Surgery

## 2013-04-11 ENCOUNTER — Encounter (INDEPENDENT_AMBULATORY_CARE_PROVIDER_SITE_OTHER): Payer: Self-pay | Admitting: General Surgery

## 2013-04-11 VITALS — BP 142/90 | HR 80 | Resp 16 | Ht 63.5 in | Wt 145.0 lb

## 2013-04-11 DIAGNOSIS — K432 Incisional hernia without obstruction or gangrene: Secondary | ICD-10-CM

## 2013-04-11 NOTE — Progress Notes (Signed)
Patient ID: Stephanie Mays, female   DOB: Oct 30, 1950, 62 y.o.   MRN: [id number]  Chief Complaint  Patient presents with  . New Evaluation    eval abd hernia    HPI Stephanie Mays is a 62 y.o. female.    HPI 62 year old [place] American female referred by Dr. Lodema Hong for evaluation of a ventral hernia. The patient states that the hernia has been there at least since 2007 2008. The patient states that it doesn't bother her. Occasionally it may cause some mild discomfort but she denies any pain. She denies any nausea, vomiting, diarrhea or constipation. She reports a daily bowel movement. She states area has never been hard, swollen or tender. She states she had a prior total abdominal hysterectomy and in 1990s. She denies any weight change. She denies any other prior surgery.  Past Medical History  Diagnosis Date  . Anemia   . Diabetes mellitus   . Hypertension   . Hyperlipidemia   . Arthritis 1999    rheumatoid  . RA (rheumatoid arthritis)   . Neuromuscular disorder     Past Surgical History  Procedure Laterality Date  . Abdominal hysterectomy    . Colonoscopy  06/27/2011    Procedure: COLONOSCOPY;  Surgeon: Malissa Hippo, MD;  [facility]: [facility];  Service: Endoscopy;  Laterality: N/A;  7:30    Family History  Problem Relation Age of Onset  . Cancer Father     throat  . Diabetes Father   . Hypertension Father   . Diabetes Sister   . Hypertension Sister   . Diabetes Sister   . Cancer Brother     colon  . Cancer Mother     leukemia    Social History History  Substance Use Topics  . Smoking status: Never Smoker   . Smokeless tobacco: Never Used  . Alcohol Use: No    No Known Allergies  Current Outpatient Prescriptions  Medication Sig Dispense Refill  . ACCU-CHEK AVIVA PLUS test strip TEST ONCE DAILY AS DIRECTED.  50 each  4  . alendronate (FOSAMAX) 70 MG tablet TAKE 1 TAB EACH WEEK 30 MIN PRIOR TO BREAKFAST WITH LARGE GLASS OF WATER. REMAIN UPRIGHT.  4  tablet  6  . aspirin 81 MG tablet Take 81 mg by mouth daily.        . Calcium Citrate-Vitamin D (CITRACAL + D PO) Take 1 tablet by mouth daily.        Marland Kitchen EXFORGE 10-320 MG per tablet TAKE 1 TABLET EVERY DAY.  30 tablet  3  . Flaxseed, Linseed, (FLAX SEED OIL PO) Take 1 tablet by mouth daily.        . folic acid (FOLVITE) 1 MG tablet TAKE 1 TABLET EVERY DAY.  30 tablet  3  . glipiZIDE (GLUCOTROL) 10 MG tablet TAKE (1) TABLET BY MOUTH TWICE DAILY BEFORE A MEAL.  60 tablet  3  . hydrochlorothiazide (HYDRODIURIL) 25 MG tablet TAKE ONE TABLET BY MOUTH ONCE DAILY.  30 tablet  3  . HYDROcodone-acetaminophen (LORTAB) 7.5-500 MG per tablet Take 1 tablet by mouth 4 (four) times daily as needed. Pain      . KOMBIGLYZE XR 2.11-998 MG TB24 TAKE 1 TABLET BY MOUTH TWICE A DAY WITH FOOD FOR DIABETES.  60 tablet  3  . methotrexate (RHEUMATREX) 2.5 MG tablet Take 20 mg by mouth once a week. 8 tabs once weekly. On Friday      . metoprolol succinate (TOPROL-XL) 100 MG  24 hr tablet TAKE ONE TABLET BY MOUTH ONCE DAILY.  30 tablet  3  . Multiple Vitamin (MULTIVITAMIN) tablet Take 1 tablet by mouth daily.        . naproxen (NAPROSYN) 500 MG tablet Take 500 mg by mouth as needed. Pain      . predniSONE (DELTASONE) 5 MG tablet Take 5 mg by mouth daily.      . insulin glargine (LANTUS SOLOSTAR) 100 UNIT/ML injection Inject 0.1 mLs (10 Units total) into the skin at bedtime.  15 mL  12   No current facility-administered medications for this visit.    Review of Systems Review of Systems  Constitutional: Negative for fever, chills and unexpected weight change.  HENT: Negative for hearing loss, congestion, sore throat, trouble swallowing and voice change.   Eyes: Negative for visual disturbance.  Respiratory: Negative for cough, shortness of breath and wheezing.   Cardiovascular: Negative for chest pain, palpitations and leg swelling.       No CP, DOE, SOB, orthopnea, PND  Gastrointestinal: Negative for nausea, vomiting,  abdominal pain, diarrhea, constipation, blood in stool and abdominal distention.  Genitourinary: Negative for hematuria, vaginal bleeding and difficulty urinating.  Musculoskeletal: Negative for arthralgias.  Skin: Negative for rash and wound.  Neurological: Negative for seizures, syncope, facial asymmetry, speech difficulty, weakness and headaches.  Hematological: Negative for adenopathy. Does not bruise/bleed easily.  Psychiatric/Behavioral: Negative for confusion.    Blood pressure 142/90, pulse 80, resp. rate 16, height 5' 3.5" (1.613 m), weight 145 lb (65.772 kg).  Physical Exam Physical Exam  Vitals reviewed. Constitutional: She is oriented to person, place, and time. She appears well-developed and well-nourished. No distress.  overweight  HENT:  Head: Normocephalic and atraumatic.  Right Ear: External ear normal.  Left Ear: External ear normal.  Eyes: Conjunctivae are normal. No scleral icterus.  Neck: Normal range of motion. Neck supple. No tracheal deviation present. No thyromegaly present.  Cardiovascular: Normal rate and normal heart sounds.   Pulmonary/Chest: Effort normal and breath sounds normal. No stridor. No respiratory distress. She has no wheezes.  Abdominal: Soft. She exhibits no distension. There is no tenderness. There is no rebound and no guarding. A hernia is present. Hernia confirmed positive in the ventral area.    Musculoskeletal: She exhibits no edema and no tenderness.  Lymphadenopathy:    She has no cervical adenopathy.  Neurological: She is alert and oriented to person, place, and time. She exhibits normal muscle tone.  Skin: Skin is warm and dry. No rash noted. She is not diaphoretic. No erythema.  Psychiatric: She has a normal mood and affect. Her behavior is normal. Judgment and thought content normal.    Data Reviewed Dr Anthony Sar notes CT abd/pelvis from 2012  Assessment    Ventral incisional hernia IDDM Rheumatoid Arthritis on daily  prednisone Overweight     Plan    We discussed the etiology of ventral hernias. We discussed the signs and symptoms of incarceration and strangulation. The patient was given educational material. I also drew diagrams.  We discussed nonoperative and operative management. With respect to operative management, we discussed laparoscopic repair.   We discussed the risk and benefits of surgery including but not limited to bleeding, infection, injury to surrounding structures, hernia recurrence, mesh complications, hematoma/seroma formation, need to convert to an open procedure, blood clot formation, urinary retention, post operative ileus, general anesthesia risk, long-term abdominal pain. We discussed that this procedure can be quite uncomfortable and difficult to recover from based  on how the mesh is secured to the abdominal wall. We discussed the importance of avoiding heavy lifting and straining for a period of 6 weeks.  The patient has elected to To think about it. She is not sure if she would like to pursue surgery. I instructed the patient to call our office if she has any questions or concerns or if she would like to schedule surgery in the future. Followup as needed  Mary Sella. Andrey Campanile, MD, FACS General, Bariatric, & Minimally Invasive Surgery [facility], [place]            [facility] M 04/11/2013, 10:19 AM

## 2013-04-11 NOTE — Patient Instructions (Signed)
Call our office with additional questions or if you would like to proceed with surgery [phone]  Ventral Hernia A ventral hernia (also called an incisional hernia) is a hernia that occurs at the site of a previous surgical cut (incision) in the abdomen. The abdominal wall spans from your lower chest down to your pelvis. If the abdominal wall is weakened from a surgical incision, a hernia can occur. A hernia is a bulge of bowel or muscle tissue pushing out on the weakened part of the abdominal wall. Ventral hernias can get bigger from straining or lifting. Obese and older people are at higher risk for a ventral hernia. People who develop infections after surgery or require repeat incisions at the same site on the abdomen are also at increased risk. CAUSES  A ventral hernia occurs because of weakness in the abdominal wall at an incision site.  SYMPTOMS  Common symptoms include:  A visible bulge or lump on the abdominal wall.  Pain or tenderness around the lump.  Increased discomfort if you cough or make a sudden movement. If the hernia has blocked part of the intestine, a serious complication can occur (incarcerated or strangulated hernia). This can become a problem that requires emergency surgery because the blood flow to the blocked intestine may be cut off. Symptoms may include:  Feeling sick to your stomach (nauseous).  Throwing up (vomiting).  Stomach swelling (distention) or bloating.  Fever.  Rapid heartbeat. DIAGNOSIS  Your caregiver will take a medical history and perform a physical exam. Various tests may be ordered, such as:  Blood tests.  Urine tests.  Ultrasonography.  X-rays.  Computed tomography (CT). TREATMENT  Watchful waiting may be all that is needed for a smaller hernia that does not cause symptoms. Your caregiver may recommend the use of a supportive belt (truss) that helps to keep the abdominal wall intact. For larger hernias or those that cause pain,  surgery to repair the hernia is usually recommended. If a hernia becomes strangulated, emergency surgery needs to be done right away. HOME CARE INSTRUCTIONS  Avoid putting pressure or strain on the abdominal area.  Avoid heavy lifting.  Use good body positioning for physical tasks. Ask your caregiver about proper body positioning.  Use a supportive belt as directed by your caregiver.  Maintain a healthy weight.  Eat foods that are high in fiber, such as whole grains, fruits, and vegetables. Fiber helps prevent difficult bowel movements (constipation).  Drink enough fluids to keep your urine clear or pale yellow.  Follow up with your caregiver as directed. SEEK MEDICAL CARE IF:   Your hernia seems to be getting larger or more painful. SEEK IMMEDIATE MEDICAL CARE IF:   You have abdominal pain that is sudden and sharp.  Your pain becomes severe.  You have repeated vomiting.  You are sweating a lot.  You notice a rapid heartbeat.  You develop a fever. MAKE SURE YOU:   Understand these instructions.  Will watch your condition.  Will get help right away if you are not doing well or get worse. Document Released: 06/26/2012 Document Reviewed: 06/14/2012 [facility] Patient Information 2014 [place], [place].

## 2013-04-30 ENCOUNTER — Encounter (HOSPITAL_COMMUNITY): Payer: Medicare Other | Attending: Oncology

## 2013-04-30 DIAGNOSIS — D509 Iron deficiency anemia, unspecified: Secondary | ICD-10-CM | POA: Insufficient documentation

## 2013-05-01 ENCOUNTER — Other Ambulatory Visit: Payer: Self-pay | Admitting: Family Medicine

## 2013-05-21 ENCOUNTER — Encounter (INDEPENDENT_AMBULATORY_CARE_PROVIDER_SITE_OTHER): Payer: Self-pay

## 2013-05-21 ENCOUNTER — Ambulatory Visit (INDEPENDENT_AMBULATORY_CARE_PROVIDER_SITE_OTHER): Payer: Medicare Other

## 2013-05-21 DIAGNOSIS — Z23 Encounter for immunization: Secondary | ICD-10-CM

## 2013-06-09 ENCOUNTER — Telehealth: Payer: Self-pay

## 2013-06-09 NOTE — Telephone Encounter (Signed)
Patient contacted on behalf of [facility] she is due for a lipid panel.  She is asked to contact Dr. Anthony Sar office for lab work.  Lipid panel was ordered 02/19/13, the patient did not complete. She states that she will contact Dr. Anthony Sar office for new orders.

## 2013-06-20 ENCOUNTER — Other Ambulatory Visit: Payer: Self-pay | Admitting: Family Medicine

## 2013-06-25 LAB — COMPLETE METABOLIC PANEL WITH GFR
AST: 14 U/L (ref 0–37)
Albumin: 4.3 g/dL (ref 3.5–5.2)
BUN: 14 mg/dL (ref 6–23)
Calcium: 10.1 mg/dL (ref 8.4–10.5)
Chloride: 104 mEq/L (ref 96–112)
Potassium: 4.2 mEq/L (ref 3.5–5.3)

## 2013-06-25 LAB — LIPID PANEL
Cholesterol: 217 mg/dL — ABNORMAL HIGH (ref 0–200)
VLDL: 39 mg/dL (ref 0–40)

## 2013-06-25 LAB — MICROALBUMIN / CREATININE URINE RATIO
Creatinine, Urine: 124.9 mg/dL
Microalb Creat Ratio: 13.9 mg/g (ref 0.0–30.0)

## 2013-06-26 LAB — HEMOGLOBIN A1C: Hgb A1c MFr Bld: 8.4 % — ABNORMAL HIGH (ref <5.7)

## 2013-06-27 ENCOUNTER — Other Ambulatory Visit: Payer: Self-pay | Admitting: Family Medicine

## 2013-07-01 ENCOUNTER — Ambulatory Visit (INDEPENDENT_AMBULATORY_CARE_PROVIDER_SITE_OTHER): Payer: Medicare Other | Admitting: Family Medicine

## 2013-07-01 ENCOUNTER — Encounter (INDEPENDENT_AMBULATORY_CARE_PROVIDER_SITE_OTHER): Payer: Self-pay

## 2013-07-01 ENCOUNTER — Encounter: Payer: Self-pay | Admitting: Family Medicine

## 2013-07-01 VITALS — BP 118/76 | HR 98 | Resp 18 | Ht 62.5 in | Wt 145.1 lb

## 2013-07-01 DIAGNOSIS — M899 Disorder of bone, unspecified: Secondary | ICD-10-CM

## 2013-07-01 DIAGNOSIS — R05 Cough: Secondary | ICD-10-CM | POA: Insufficient documentation

## 2013-07-01 DIAGNOSIS — E785 Hyperlipidemia, unspecified: Secondary | ICD-10-CM | POA: Insufficient documentation

## 2013-07-01 DIAGNOSIS — I10 Essential (primary) hypertension: Secondary | ICD-10-CM

## 2013-07-01 DIAGNOSIS — M069 Rheumatoid arthritis, unspecified: Secondary | ICD-10-CM

## 2013-07-01 DIAGNOSIS — Z1211 Encounter for screening for malignant neoplasm of colon: Secondary | ICD-10-CM

## 2013-07-01 DIAGNOSIS — E1065 Type 1 diabetes mellitus with hyperglycemia: Secondary | ICD-10-CM

## 2013-07-01 MED ORDER — PRAVASTATIN SODIUM 20 MG PO TABS: 20.0000 mg | ORAL_TABLET | Freq: Every day | ORAL | Status: DC

## 2013-07-01 MED ORDER — BENZONATATE 100 MG PO CAPS: 100.0000 mg | ORAL_CAPSULE | Freq: Three times a day (TID) | ORAL | Status: AC | PRN

## 2013-07-01 MED ORDER — INSULIN GLARGINE 100 UNIT/ML SOLOSTAR PEN: PEN_INJECTOR | SUBCUTANEOUS | Status: DC

## 2013-07-01 NOTE — Patient Instructions (Addendum)
F/u in 3.5 month, call if you need me before  Blood sugar increased and uncontrolled, increase lantus to 15 units daily and reduce sweets and carbs  Rectal today normal  You are having chest wall pain  Tessalon perles prescribed for "wheeze"  Pls sign for eye exam to be sent  New medication for cholesterol which is too high , pravachol 20mg  one at night, also cut back on fried and fatty foods  HBA1c fasting lipid cmp and EGFR in 3.5 month, before visit

## 2013-07-01 NOTE — Progress Notes (Signed)
   Subjective:    Patient ID: Stephanie Mays, female    DOB: 22-Jul-1951, 62 y.o.   MRN: [id number]  HPI The PT is here for follow up and re-evaluation of chronic medical conditions, medication management and review of any available recent lab and radiology data.  Preventive health is updated, specifically  Cancer screening and Immunization.   Questions or concerns regarding consultations or procedures which the PT has had in the interim are  Addressed.Has been evaluated by surgery re hernia, watchful waiting only at this time The PT denies any adverse reactions to current medications since the last visit.  1 week h/o increased cough and wheeze, esp at night, no fever , chills or sputum, localized chest discomfort. Reports fluctuation of blood sugars and dietary indiscretion. Denies polyuria, polydipsia or blurred vision       Review of Systems See HPI Denies recent fever or chills. Denies sinus pressure, nasal congestion, ear pain or sore throat.  Denies chest pains, palpitations and leg swelling Denies abdominal pain, nausea, vomiting,diarrhea or constipation.  No change in BM Denies dysuria, frequency, hesitancy or incontinence. Denies joint pain, swelling and limitation in mobility. Denies headaches, seizures, numbness, or tingling. Denies depression, anxiety or insomnia. Denies skin break down or rash.        Objective:   Physical Exam  Patient alert and oriented and in no cardiopulmonary distress.  HEENT: No facial asymmetry, EOMI, no sinus tenderness,  oropharynx pink and moist.  Neck supple no adenopathy.  Chest: Clear to auscultation bilaterally.  CVS: S1, S2 no murmurs, no S3.  ABD: Soft non tender. Bowel sounds normal. Rectal: no mass, heme negative stool Ext: No edema  MS: Adequate ROM spine, shoulders, hips and knees.Marked deformity of fingers  Skin: Intact, no ulcerations or rash noted.  Psych: Good eye contact, normal affect. Memory intact not  anxious or depressed appearing.  CNS: CN 2-12 intact, power,  normal throughout.       Assessment & Plan:

## 2013-07-02 ENCOUNTER — Other Ambulatory Visit: Payer: Self-pay

## 2013-07-02 MED ORDER — GLIPIZIDE 10 MG PO TABS: ORAL_TABLET | ORAL | Status: DC

## 2013-07-02 MED ORDER — HYDROCHLOROTHIAZIDE 25 MG PO TABS: ORAL_TABLET | ORAL | Status: DC

## 2013-07-02 MED ORDER — FOLIC ACID 1 MG PO TABS: ORAL_TABLET | ORAL | Status: DC

## 2013-07-02 MED ORDER — METOPROLOL SUCCINATE ER 100 MG PO TB24: ORAL_TABLET | ORAL | Status: DC

## 2013-07-02 MED ORDER — SAXAGLIPTIN-METFORMIN ER 2.5-1000 MG PO TB24: ORAL_TABLET | ORAL | Status: DC

## 2013-07-06 NOTE — Assessment & Plan Note (Signed)
Recent increase in cough with "wheeze" no sputum, tessalon perles prescribed for use at night , when cough , which i believe is allergy driven is worse

## 2013-07-06 NOTE — Assessment & Plan Note (Signed)
Needs to continue weekly bisphosphonate , she is on chronic steroid

## 2013-07-06 NOTE — Assessment & Plan Note (Signed)
Controlled, no change in medication DASH diet and commitment to daily physical activity for a minimum of 30 minutes discussed and encouraged, as a part of hypertension management. The importance of attaining a healthy weight is also discussed.  

## 2013-07-06 NOTE — Assessment & Plan Note (Signed)
Deteriorated, needs to change diet and also increwase in medication Patient advised to reduce carb and sweets, commit to regular physical activity, take meds as prescribed, test blood as directed, and attempt to lose weight, to improve blood sugar control.

## 2013-07-06 NOTE — Assessment & Plan Note (Signed)
Needs to start statin  Hyperlipidemia:Low fat diet discussed and encouraged.

## 2013-07-06 NOTE — Assessment & Plan Note (Signed)
Stable , but severe disease, treated by rheumatologist

## 2013-07-31 ENCOUNTER — Encounter (HOSPITAL_COMMUNITY): Payer: Medicare Other | Attending: Oncology

## 2013-07-31 DIAGNOSIS — D509 Iron deficiency anemia, unspecified: Secondary | ICD-10-CM | POA: Insufficient documentation

## 2013-07-31 LAB — CBC WITH DIFFERENTIAL/PLATELET
Basophils Absolute: 0 10*3/uL (ref 0.0–0.1)
Basophils Relative: 0 % (ref 0–1)
EOS ABS: 0.1 10*3/uL (ref 0.0–0.7)
Eosinophils Relative: 1 % (ref 0–5)
HCT: 39 % (ref 36.0–46.0)
Hemoglobin: 12.6 g/dL (ref 12.0–15.0)
LYMPHS PCT: 16 % (ref 12–46)
Lymphs Abs: 1.4 10*3/uL (ref 0.7–4.0)
MCH: 31.1 pg (ref 26.0–34.0)
MCHC: 32.3 g/dL (ref 30.0–36.0)
MCV: 96.3 fL (ref 78.0–100.0)
Monocytes Absolute: 1.1 10*3/uL — ABNORMAL HIGH (ref 0.1–1.0)
Monocytes Relative: 13 % — ABNORMAL HIGH (ref 3–12)
NEUTROS PCT: 69 % (ref 43–77)
Neutro Abs: 6 10*3/uL (ref 1.7–7.7)
Platelets: 354 10*3/uL (ref 150–400)
RBC: 4.05 MIL/uL (ref 3.87–5.11)
RDW: 13.9 % (ref 11.5–15.5)
WBC: 8.6 10*3/uL (ref 4.0–10.5)

## 2013-07-31 LAB — IRON AND TIBC
Iron: 41 ug/dL — ABNORMAL LOW (ref 42–135)
Saturation Ratios: 16 % — ABNORMAL LOW (ref 20–55)
TIBC: 261 ug/dL (ref 250–470)
UIBC: 220 ug/dL (ref 125–400)

## 2013-07-31 LAB — FERRITIN: FERRITIN: 243 ng/mL (ref 10–291)

## 2013-07-31 NOTE — Progress Notes (Signed)
Labs drawn today for cbc/diff,Iron and IBC,ferr 

## 2013-07-31 NOTE — Progress Notes (Signed)
Tula Nakayama, MD [address] [place] [address]  ANEMIA, IRON DEFICIENCY - Plan: CBC with Differential, Iron and TIBC, Ferritin  CURRENT THERAPY: S/P Feraheme 1020 mg IV on 04/02/2012  INTERVAL HISTORY: Joseph Pierini 63 y.o. female returns for  regular  visit for followup of iron deficiency anemia and anemia of chronic disease with an element of rheumatoid arthritis treatment contributing to anemia.   I personally reviewed and went over laboratory results with the patient.  Results follow below.   I personally reviewed and went over radiographic studies with the patient.  Last screening mammogram on 03/26/2013 was BIRADS 1.  She will be due for her next screening mammogram in 03/2014.  Chart is reviewed and her Hgb A1c was noted to be 8.4 on 06/25/2013.  Diabetic diet was encouraged today.  From a hematologic standpoint, our role is to maintain her iron stores to eliminate 1 cause of her anemia.  Granted she has an element of anemia of chronic disease from rheumatoid arthritis and diabetes mellitus.    Hematologically, she denies any complaints and ROS questioning is negative.    Past Medical History  Diagnosis Date  . Anemia   . Diabetes mellitus   . Hypertension   . Hyperlipidemia   . Arthritis 1999    rheumatoid  . RA (rheumatoid arthritis)   . Neuromuscular disorder     has Diabetes mellitus, insulin dependent (IDDM), uncontrolled; DIABETIC PERIPHERAL NEUROPATHY; ANEMIA, IRON DEFICIENCY; HYPERTENSION; ARTHRITIS, RHEUMATOID; OSTEOPENIA; Routine general medical examination at a health care facility; Incisional hernia without mention of obstruction or gangrene; Hyperlipidemia LDL goal < 100; and Cough on her problem list.     has No Known Allergies.  Ms. Rabelo does not currently have medications on file.  Past Surgical History  Procedure Laterality Date  . Abdominal hysterectomy    . Colonoscopy  06/27/2011    Procedure: COLONOSCOPY;  Surgeon: Rogene Houston, MD;  Location: [facility];  Service: Endoscopy;  Laterality: N/A;  7:30    Denies any headaches, dizziness, double vision, fevers, chills, night sweats, nausea, vomiting, diarrhea, constipation, chest pain, heart palpitations, shortness of breath, blood in stool, black tarry stool, urinary pain, urinary burning, urinary frequency, hematuria.   PHYSICAL EXAMINATION  ECOG PERFORMANCE STATUS: 1 - Symptomatic but completely ambulatory  Filed Vitals:   08/01/13 1000  BP: 116/75  Pulse: 91  Temp: 98.7 F (37.1 C)  Resp: 18    GENERAL:alert, no distress, well nourished, well developed, comfortable, cooperative and smiling  SKIN: skin color, texture, turgor are normal, no rashes or significant lesions  HEAD: Normocephalic, No masses, lesions, tenderness or abnormalities  EYES: normal, PERRLA, EOMI, Conjunctiva are pink and non-injected  EARS: External ears normal  OROPHARYNX:mucous membranes are moist  NECK: supple, no adenopathy, thyroid normal size, non-tender, without nodularity, no stridor, non-tender, trachea midline  LYMPH: not examined  BREAST:not examined  LUNGS: clear to auscultation and percussion  HEART: regular rate & rhythm, no murmurs, no gallops, S1 normal and S2 normal  ABDOMEN:abdomen soft, non-tender and normal bowel sounds  BACK: Back symmetric, no curvature.  EXTREMITIES:positive findings: Heberden's nodes present on DIP's of hands, Bouchard's nodes present, tenderness and swelling of PIP and MCP joints and ulnar deviation  NEURO: alert & oriented x 3 with fluent speech, no focal motor/sensory deficits, gait normal   LABORATORY DATA: CBC    Component Value Date/Time   WBC 8.6 07/31/2013 0953   RBC 4.05  07/31/2013 0953   RBC 3.97 03/26/2012 1515   HGB 12.6 07/31/2013 0953   HCT 39.0 07/31/2013 0953   PLT 354 07/31/2013 0953   MCV 96.3 07/31/2013 0953   MCH 31.1 07/31/2013 0953   MCHC 32.3 07/31/2013 0953   RDW 13.9 07/31/2013 0953   LYMPHSABS 1.4 07/31/2013 0953    MONOABS 1.1* 07/31/2013 0953   EOSABS 0.1 07/31/2013 0953   BASOSABS 0.0 07/31/2013 0953      Chemistry      Component Value Date/Time   NA 141 06/25/2013 0848   K 4.2 06/25/2013 0848   CL 104 06/25/2013 0848   CO2 25 06/25/2013 0848   BUN 14 06/25/2013 0848   CREATININE 0.81 06/25/2013 0848   CREATININE 0.62 01/29/2012 0654      Component Value Date/Time   CALCIUM 10.1 06/25/2013 0848   ALKPHOS 92 06/25/2013 0848   AST 14 06/25/2013 0848   ALT 26 06/25/2013 0848   BILITOT 0.3 06/25/2013 0848     Lab Results  Component Value Date   IRON 41* 07/31/2013   TIBC 261 07/31/2013   FERRITIN 243 07/31/2013     RADIOGRAPHIC STUDIES:  03/26/2013  *RADIOLOGY REPORT*  Clinical Data: Screening.  DIGITAL SCREENING BILATERAL MAMMOGRAM WITH CAD  Comparison: Previous exam(s).  FINDINGS:  ACR Breast Density Category b: There are scattered areas of  fibroglandular density.  There are no findings suspicious for malignancy.  Images were processed with CAD.  IMPRESSION:  No mammographic evidence of malignancy.  A result letter of this screening mammogram will be mailed directly  to the patient.  RECOMMENDATION:  Screening mammogram in one year. (Code:SM-B-01Y)  BI-RADS CATEGORY 1: Negative.  Original Report Authenticated By: Lillia Mountain, M.D.    ASSESSMENT:  1. Anemia of chronic disease with a low-normal TIBC and low % saturation.  2. Definite element of iron deficiency anemia with low serum iron less than 10 and non-calculable TIBC in the past. S/P Feraheme 1020 mg IV on 04/02/2012 with great response.  3. Anemia, secondary to #1 and 2 in addition to treatment of #4 contributing to anemia with Methotrexate as directed by Rheumatologist.  4. Rheumatoid arthritis, on methotrexate. Managed by Dr. Ouida Sills (Rheumatologist) in [place].  5. Poorly controlled DM with Hgb A1C on 03/04/2012 of 8.1  6. HTN  7. Intolerance to PO iron tablets due to constipation, nausea.  Patient Active Problem List   Diagnosis  Date Noted  . Hyperlipidemia LDL goal < 100 07/01/2013  . Cough 07/01/2013  . Incisional hernia without mention of obstruction or gangrene 04/11/2013  . Routine general medical examination at a health care facility 07/14/2012  . Diabetes mellitus, insulin dependent (IDDM), uncontrolled 08/13/2008  . DIABETIC PERIPHERAL NEUROPATHY 11/13/2007  . OSTEOPENIA 02/26/2007  . ANEMIA, IRON DEFICIENCY 09/27/2006  . ARTHRITIS, RHEUMATOID 09/27/2006  . HYPERTENSION 07/08/2006     PLAN:  1. I personally reviewed and went over laboratory results with the patient. 2. I personally reviewed and went over radiographic studies with the patient. 3. Next screening mammogram will be due in Sept 2015 4. Labs in 3 months and 6 months: CBC diff, Iron/TIBC, Ferritin 5. Continue RA treatment per rheumatologist  6. Continue DM control per PCP  7. Return in 6 months for follow-up.   THERAPY PLAN:  She has responded well to past IV Feraheme infusions. We will continue to monitor iron studies and provide IV Feraheme as indicated.    All questions were answered. The patient knows to call the  clinic with any problems, questions or concerns. We can certainly see the patient much sooner if necessary.  Patient and plan discussed with Dr. Farrel Gobble and he is in agreement with the aforementioned.   Fathima Bartl

## 2013-08-01 ENCOUNTER — Encounter (HOSPITAL_BASED_OUTPATIENT_CLINIC_OR_DEPARTMENT_OTHER): Payer: Medicare Other | Admitting: Oncology

## 2013-08-01 ENCOUNTER — Encounter (HOSPITAL_COMMUNITY): Payer: Self-pay | Admitting: Oncology

## 2013-08-01 VITALS — BP 116/75 | HR 91 | Temp 98.7°F | Resp 18 | Wt 146.4 lb

## 2013-08-01 DIAGNOSIS — D638 Anemia in other chronic diseases classified elsewhere: Secondary | ICD-10-CM

## 2013-08-01 DIAGNOSIS — D509 Iron deficiency anemia, unspecified: Secondary | ICD-10-CM

## 2013-08-01 NOTE — Patient Instructions (Signed)
[facility] Discharge Instructions  RECOMMENDATIONS MADE BY THE CONSULTANT AND ANY TEST RESULTS WILL BE SENT TO YOUR REFERRING PHYSICIAN.  EXAM FINDINGS BY THE PHYSICIAN TODAY AND SIGNS OR SYMPTOMS TO REPORT TO CLINIC OR PRIMARY PHYSICIAN: Exam and findings as discussed by Robynn Pane, PA-C. You are doing well blood work is stable.  Report increased fatigue, shortness of breath, increased ice intake, etc.  MEDICATIONS PRESCRIBED:  none  INSTRUCTIONS/FOLLOW-UP: Blood work in 3 and 6 months, office visit in 6 months.  Thank you for choosing [facility] to provide your oncology and hematology care.  To afford each patient quality time with our providers, please arrive at least 15 minutes before your scheduled appointment time.  With your help, our goal is to use those 15 minutes to complete the necessary work-up to ensure our physicians have the information they need to help with your evaluation and healthcare recommendations.    Effective January 1st, 2014, we ask that you re-schedule your appointment with our physicians should you arrive 10 or more minutes late for your appointment.  We strive to give you quality time with our providers, and arriving late affects you and other patients whose appointments are after yours.    Again, thank you for choosing [facility].  Our hope is that these requests will decrease the amount of time that you wait before being seen by our physicians.       _____________________________________________________________  Should you have questions after your visit to [facility], please contact our office at [phone] between the hours of 8:30 a.m. and 5:00 p.m.  Voicemails left after 4:30 p.m. will not be returned until the following business day.  For prescription refill requests, have your pharmacy contact our office with your prescription refill request.

## 2013-08-08 ENCOUNTER — Other Ambulatory Visit: Payer: Self-pay | Admitting: Family Medicine

## 2013-08-28 ENCOUNTER — Emergency Department (HOSPITAL_COMMUNITY): Payer: Medicare Other

## 2013-08-28 ENCOUNTER — Encounter (HOSPITAL_COMMUNITY): Payer: Self-pay | Admitting: Emergency Medicine

## 2013-08-28 ENCOUNTER — Emergency Department (HOSPITAL_COMMUNITY)
Admission: EM | Admit: 2013-08-28 | Discharge: 2013-08-28 | Disposition: A | Discharge status: Home / Self Care | Payer: Medicare Other | Attending: Emergency Medicine | Admitting: Emergency Medicine

## 2013-08-28 DIAGNOSIS — Y929 Unspecified place or not applicable: Secondary | ICD-10-CM | POA: Insufficient documentation

## 2013-08-28 DIAGNOSIS — IMO0002 Reserved for concepts with insufficient information to code with codable children: Secondary | ICD-10-CM | POA: Insufficient documentation

## 2013-08-28 DIAGNOSIS — D649 Anemia, unspecified: Secondary | ICD-10-CM | POA: Insufficient documentation

## 2013-08-28 DIAGNOSIS — R5381 Other malaise: Secondary | ICD-10-CM | POA: Insufficient documentation

## 2013-08-28 DIAGNOSIS — Y939 Activity, unspecified: Secondary | ICD-10-CM | POA: Insufficient documentation

## 2013-08-28 DIAGNOSIS — Z7982 Long term (current) use of aspirin: Secondary | ICD-10-CM | POA: Insufficient documentation

## 2013-08-28 DIAGNOSIS — I10 Essential (primary) hypertension: Secondary | ICD-10-CM | POA: Insufficient documentation

## 2013-08-28 DIAGNOSIS — Z79899 Other long term (current) drug therapy: Secondary | ICD-10-CM | POA: Insufficient documentation

## 2013-08-28 DIAGNOSIS — E785 Hyperlipidemia, unspecified: Secondary | ICD-10-CM | POA: Insufficient documentation

## 2013-08-28 DIAGNOSIS — E119 Type 2 diabetes mellitus without complications: Secondary | ICD-10-CM | POA: Insufficient documentation

## 2013-08-28 DIAGNOSIS — R5383 Other fatigue: Secondary | ICD-10-CM

## 2013-08-28 DIAGNOSIS — Z794 Long term (current) use of insulin: Secondary | ICD-10-CM | POA: Insufficient documentation

## 2013-08-28 DIAGNOSIS — X58XXXA Exposure to other specified factors, initial encounter: Secondary | ICD-10-CM | POA: Insufficient documentation

## 2013-08-28 DIAGNOSIS — S76219A Strain of adductor muscle, fascia and tendon of unspecified thigh, initial encounter: Secondary | ICD-10-CM

## 2013-08-28 DIAGNOSIS — M069 Rheumatoid arthritis, unspecified: Secondary | ICD-10-CM | POA: Insufficient documentation

## 2013-08-28 LAB — URINALYSIS, ROUTINE W REFLEX MICROSCOPIC
BILIRUBIN URINE: NEGATIVE
Glucose, UA: >1000 mg/dL — AB
Hgb urine dipstick: NEGATIVE
KETONES UR: NEGATIVE mg/dL
Leukocytes, UA: NEGATIVE
NITRITE: NEGATIVE
Protein, ur: NEGATIVE mg/dL
Specific Gravity, Urine: 1.02 (ref 1.005–1.030)
Urobilinogen, UA: 0.2 mg/dL (ref 0.0–1.0)
pH: 5 (ref 5.0–8.0)

## 2013-08-28 LAB — URINE MICROSCOPIC-ADD ON

## 2013-08-28 MED ORDER — IBUPROFEN 400 MG PO TABS
400.0000 mg | ORAL_TABLET | Freq: Once | ORAL | Status: AC
  Administered 2013-08-28: 400 mg via ORAL
  Filled 2013-08-28: qty 1

## 2013-08-28 MED ORDER — ONDANSETRON HCL 4 MG PO TABS
4.0000 mg | ORAL_TABLET | Freq: Once | ORAL | Status: AC
  Administered 2013-08-28: 4 mg via ORAL
  Filled 2013-08-28: qty 1

## 2013-08-28 MED ORDER — HYDROCODONE-ACETAMINOPHEN 7.5-325 MG PO TABS: 1.0000 | ORAL_TABLET | ORAL | Status: DC | PRN

## 2013-08-28 MED ORDER — IBUPROFEN 400 MG PO TABS: 400.0000 mg | ORAL_TABLET | Freq: Four times a day (QID) | ORAL | Status: DC | PRN

## 2013-08-28 MED ORDER — HYDROCODONE-ACETAMINOPHEN 5-325 MG PO TABS
1.0000 | ORAL_TABLET | Freq: Once | ORAL | Status: AC
  Administered 2013-08-28: 1 via ORAL
  Filled 2013-08-28: qty 1

## 2013-08-28 NOTE — ED Notes (Signed)
Pt seen and evaluated by EDPa for initial assessment. 

## 2013-08-28 NOTE — ED Notes (Signed)
Pt reporting pain in right groin area when she walks.  Reports pain since Monday. Denies any other problems at this time.

## 2013-08-28 NOTE — ED Provider Notes (Signed)
CSN: [id number]     Arrival date & time 08/28/13  2009 History   First MD Initiated Contact with Patient 08/28/13 2112     Chief Complaint  Patient presents with  . Groin Pain   (Consider location/radiation/quality/duration/timing/severity/associated sxs/prior Treatment) Patient is a 63 y.o. female presenting with groin pain. The history is provided by the patient.  Groin Pain This is a new problem. The current episode started in the past 7 days. The problem occurs intermittently. The problem has been gradually worsening. Associated symptoms include arthralgias and weakness. Pertinent negatives include no abdominal pain, change in bowel habit, chest pain, coughing, nausea, neck pain, numbness, urinary symptoms or vomiting. Associated symptoms comments: Pt has pain of the right groin when walking or changing positions.. The symptoms are aggravated by standing and walking. She has tried nothing for the symptoms.    Past Medical History  Diagnosis Date  . Anemia   . Diabetes mellitus   . Hypertension   . Hyperlipidemia   . Arthritis 1999    rheumatoid  . RA (rheumatoid arthritis)   . Neuromuscular disorder    Past Surgical History  Procedure Laterality Date  . Abdominal hysterectomy    . Colonoscopy  06/27/2011    Procedure: COLONOSCOPY;  Surgeon: Rogene Houston, MD;  Location: [facility];  Service: Endoscopy;  Laterality: N/A;  7:30   Family History  Problem Relation Age of Onset  . Cancer Father     throat  . Diabetes Father   . Hypertension Father   . Diabetes Sister   . Hypertension Sister   . Diabetes Sister   . Cancer Brother     colon  . Cancer Mother     leukemia   History  Substance Use Topics  . Smoking status: Never Smoker   . Smokeless tobacco: Never Used  . Alcohol Use: No   OB History   Grav Para Term Preterm Abortions TAB SAB Ect Mult Living                 Review of Systems  Constitutional: Negative for activity change.       All ROS Neg except  as noted in HPI  HENT: Negative for nosebleeds.   Eyes: Negative for photophobia and discharge.  Respiratory: Negative for cough, shortness of breath and wheezing.   Cardiovascular: Negative for chest pain and palpitations.  Gastrointestinal: Negative for nausea, vomiting, abdominal pain, blood in stool and change in bowel habit.  Genitourinary: Negative for dysuria, frequency and hematuria.  Musculoskeletal: Positive for arthralgias. Negative for back pain and neck pain.  Skin: Negative.   Neurological: Positive for weakness. Negative for dizziness, seizures, speech difficulty and numbness.  Psychiatric/Behavioral: Negative for hallucinations and confusion.    Allergies  Review of patient's allergies indicates no known allergies.  Home Medications   Current Outpatient Rx  Name  Route  Sig  Dispense  Refill  . ACCU-CHEK AVIVA PLUS test strip      TEST ONCE DAILY AS DIRECTED.   50 each   3   . ACCU-CHEK FASTCLIX LANCETS MISC      TESTING ONCE DAILY.   102 each   2   . alendronate (FOSAMAX) 70 MG tablet      TAKE 1 TAB EACH WEEK 30 MIN PRIOR TO BREAKFAST WITH LARGE GLASS OF WATER. REMAIN UPRIGHT.   4 tablet   6   . amLODipine-valsartan (EXFORGE) 10-320 MG per tablet      TAKE 1 TABLET  EVERY DAY.   30 tablet   3   . aspirin 81 MG tablet   Oral   Take 81 mg by mouth daily.           . Calcium Citrate-Vitamin D (CITRACAL + D PO)   Oral   Take 1 tablet by mouth daily.           . Flaxseed, Linseed, (FLAX SEED OIL PO)   Oral   Take 1 tablet by mouth daily.           . folic acid (FOLVITE) 1 MG tablet      TAKE 1 TABLET EVERY DAY.   30 tablet   3   . glipiZIDE (GLUCOTROL) 10 MG tablet      TAKE (1) TABLET BY MOUTH TWICE DAILY BEFORE A MEAL.   60 tablet   3   . hydrochlorothiazide (HYDRODIURIL) 25 MG tablet      TAKE ONE TABLET BY MOUTH ONCE DAILY.   30 tablet   3   . HYDROcodone-acetaminophen (LORTAB) 7.5-500 MG per tablet   Oral   Take 1  tablet by mouth 4 (four) times daily as needed. Pain         . Insulin Glargine (LANTUS SOLOSTAR) 100 UNIT/ML SOPN      15 units once daily. Dose increase effective 07/01/2013   5 pen   3     Dose increase effective 07/01/2013   . methotrexate (RHEUMATREX) 2.5 MG tablet   Oral   Take 20 mg by mouth once a week. 8 tabs once weekly. On Friday         . metoprolol succinate (TOPROL-XL) 100 MG 24 hr tablet      TAKE ONE TABLET BY MOUTH ONCE DAILY.   30 tablet   3   . Multiple Vitamin (MULTIVITAMIN) tablet   Oral   Take 1 tablet by mouth daily.           . naproxen (NAPROSYN) 500 MG tablet   Oral   Take 500 mg by mouth as needed. Pain         . pravastatin (PRAVACHOL) 20 MG tablet   Oral   Take 1 tablet (20 mg total) by mouth daily.   30 tablet   3   . predniSONE (DELTASONE) 5 MG tablet   Oral   Take 5 mg by mouth daily.         . Saxagliptin-Metformin (KOMBIGLYZE XR) 2.11-998 MG TB24      TAKE 1 TABLET BY MOUTH TWICE A DAY WITH FOOD FOR DIABETES.   60 tablet   3    BP 153/89  Pulse 96  Temp(Src) 98.7 F (37.1 C) (Oral)  Resp 18  Ht 5\' 2"  (1.575 m)  Wt 148 lb (67.132 kg)  BMI 27.06 kg/m2  SpO2 96% Physical Exam  Nursing note and vitals reviewed. Constitutional: She is oriented to person, place, and time. She appears well-developed and well-nourished.  Non-toxic appearance.  HENT:  Head: Normocephalic.  Right Ear: Tympanic membrane and external ear normal.  Left Ear: Tympanic membrane and external ear normal.  Eyes: EOM and lids are normal. Pupils are equal, round, and reactive to light.  Neck: Normal range of motion. Neck supple. Carotid bruit is not present.  Cardiovascular: Normal rate, regular rhythm, normal heart sounds, intact distal pulses and normal pulses.   Pulmonary/Chest: Breath sounds normal. No respiratory distress.  Abdominal: Soft. Bowel sounds are normal. There is no tenderness. There is no  guarding.  Musculoskeletal: Normal  range of motion.  There is increased tightness and tenseness of the muscles about the upper thigh in the inguinal area on the right. I do not palpate or demonstrate a hernia in the area. No visible abscess appreciated.  Lymphadenopathy:       Head (right side): No submandibular adenopathy present.       Head (left side): No submandibular adenopathy present.    She has no cervical adenopathy.  Neurological: She is alert and oriented to person, place, and time. She has normal strength. No cranial nerve deficit or sensory deficit.  There is some weakness of the left lower extremity, this is not new, patient states this may be related to one of her medical conditions. No other neurologic deficits appreciated.  Skin: Skin is warm and dry.  Psychiatric: She has a normal mood and affect. Her speech is normal.    ED Course  Procedures (including critical care time) Labs Review Labs Reviewed  URINALYSIS, ROUTINE W REFLEX MICROSCOPIC - Abnormal; Notable for the following:    Glucose, UA >1000 (*)    All other components within normal limits  URINE MICROSCOPIC-ADD ON   Imaging Review Dg Pelvis 1-2 Views  08/28/2013   CLINICAL DATA:  Pain no history of trauma  EXAM: PELVIS - 1-2 VIEW  COMPARISON:  None.  FINDINGS: There is no evidence of pelvic fracture or diastasis. No other pelvic bone lesions are seen.  IMPRESSION: Negative.   Electronically Signed   By: Margaree Mackintosh M.D.   On: 08/28/2013 20:54    EKG Interpretation   None       MDM  No diagnosis found. *I have reviewed nursing notes, vital signs, and all appropriate lab and imaging results for this patient.**  Pelvis x-ray is negative for any occult fracture or dislocations. Urinalysis reveals increase in glucose, but no evidence of infection or kidney stone. Suspect the patient has a groin strain. The patient has some trouble getting around, and uses a cane some of the time to help her get around.  The patient will be treated with  ibuprofen 400 mg, and Norco every 4 hours. Patient advised to see her primary physician Dr. Moshe Cipro for additional evaluation and management.  Lenox Ahr, PA-C 08/28/13 2143

## 2013-08-28 NOTE — Discharge Instructions (Signed)
The x-ray of your pelvis is negative for fracture or dislocation. Your urine analysis shows increased sugar present, but no evidence of infection or kidney stone. Your examination is consistent with a muscle strain, or groin strain. Please rest or groin is much as possible. Use ibuprofen 400 mg every 6 hours for the next 3 or 4 days. Use Norco for more severe pain. Norco may cause drowsiness, and/or constipation. Please use this medication with caution. Please see Dr. Moshe Cipro in the office for additional evaluation and management of this pain. Groin Strain A groin strain (also called a groin pull) is an injury to the muscles or tendon on the upper inner part of the thigh. These muscles are called the adductor muscles or groin muscles. They are responsible for moving the leg across the body. A muscle strain occurs when a muscle is overstretched and some muscle fibers are torn. A groin strain can range from mild to severe depending on how many muscle fibers are affected and whether the muscle fibers are partially or completely torn.  Groin strains usually occur during exercise or participation in sports. The injury often happens when a sudden, violent force is placed on a muscle, stretching the muscle too far. A strain is more likely to occur when your muscles are not warmed up or if you are not properly conditioned. Depending on the severity of the groin strain, recovery time may vary from a few weeks to several weeks. Severe injuries often require 4 6 weeks for recovery. In these cases, complete healing can take 4 5 months.  CAUSES   Stretching the groin muscles too far or too suddenly, often during side-to-side motion with an abrupt change in direction.  Putting repeated stress on the groin muscles over a long period of time.  Performing vigorous activity without properly stretching the groin muscles beforehand. SYMPTOMS   Pain and tenderness in the groin area. This begins as sharp pain and persists as  a dull ache.  Popping or snapping feeling when the injury occurs (for severe strains).  Swelling or bruising.  Muscle spasms.  Weakness in the leg.  Stiffness in the groin area with decreased ability to move the affected muscles. DIAGNOSIS  Your caregiver will perform a physical exam to diagnose a groin strain. You will be asked about your symptoms and how the injury occurred. X-rays are sometimes needed to rule out a broken bone or cartilage problems. Your caregiver may order a CT scan or MRI if a complete muscle tear is suspected. TREATMENT  A groin strain will often heal on its own. Your caregiver may prescribe medicines to help manage pain and swelling (anti-inflammatory medicine). You may be told to use crutches for the first few days to minimize your pain. HOME CARE INSTRUCTIONS   Rest. Do not use the strained muscle if it causes pain.  Put ice on the injured area.  Put ice in a plastic bag.  Place a towel between your skin and the bag.  Leave the ice on for 15 20 minutes, every 2 3 hours. Do this for the first 2 days after the injury.  Only take over-the-counter or prescription medicines as directed by your caregiver.  Wrap the injured area with an elastic bandage as directed by your caregiver.  Keep the injured leg raised (elevated).  Walk, stretch, and perform range-of-motion exercises to improve blood flow to the injured area. Only perform these activities if you can do so without any pain. To prevent muscle strains:  Warm up before exercise.  Develop proper conditioning and strength in the groin muscles. SEEK IMMEDIATE MEDICAL CARE IF:   You have increased pain or swelling in the affected area.   Your symptoms are not improving or are getting worse. MAKE SURE YOU:   Understand these instructions.  Will watch your condition.  Will get help right away if you are not doing well or get worse. Document Released: 03/07/2004 Document Revised: 06/26/2012  Document Reviewed: 03/13/2012 [facility] Patient Information 2014 [place], [place].

## 2013-09-02 NOTE — ED Provider Notes (Signed)
Medical screening examination/treatment/procedure(s) were conducted as a shared visit with non-physician practitioner(s) and myself.  I personally evaluated the patient during the encounter.  EKG Interpretation   None      Patient seen and evaluated. Pain with active ROM, no pain with passive ROM - pain at hip flexor region. No fall, no concern for occult fracture.   Orpah Greek, MD 09/02/13 [phone]

## 2013-09-05 ENCOUNTER — Other Ambulatory Visit: Payer: Self-pay | Admitting: Family Medicine

## 2013-09-09 ENCOUNTER — Ambulatory Visit: Payer: Medicare Other | Admitting: Family Medicine

## 2013-09-15 ENCOUNTER — Encounter: Payer: Self-pay | Admitting: Family Medicine

## 2013-09-15 ENCOUNTER — Encounter (INDEPENDENT_AMBULATORY_CARE_PROVIDER_SITE_OTHER): Payer: Self-pay

## 2013-09-15 ENCOUNTER — Ambulatory Visit (INDEPENDENT_AMBULATORY_CARE_PROVIDER_SITE_OTHER): Payer: Medicare Other | Admitting: Family Medicine

## 2013-09-15 VITALS — BP 146/82 | HR 100 | Resp 18 | Ht 62.5 in | Wt 140.1 lb

## 2013-09-15 DIAGNOSIS — E1065 Type 1 diabetes mellitus with hyperglycemia: Secondary | ICD-10-CM

## 2013-09-15 DIAGNOSIS — E1165 Type 2 diabetes mellitus with hyperglycemia: Secondary | ICD-10-CM

## 2013-09-15 DIAGNOSIS — M069 Rheumatoid arthritis, unspecified: Secondary | ICD-10-CM

## 2013-09-15 DIAGNOSIS — IMO0001 Reserved for inherently not codable concepts without codable children: Secondary | ICD-10-CM

## 2013-09-15 DIAGNOSIS — IMO0002 Reserved for concepts with insufficient information to code with codable children: Secondary | ICD-10-CM

## 2013-09-15 DIAGNOSIS — M79672 Pain in left foot: Secondary | ICD-10-CM

## 2013-09-15 DIAGNOSIS — Z794 Long term (current) use of insulin: Secondary | ICD-10-CM

## 2013-09-15 DIAGNOSIS — E785 Hyperlipidemia, unspecified: Secondary | ICD-10-CM

## 2013-09-15 DIAGNOSIS — M79609 Pain in unspecified limb: Secondary | ICD-10-CM

## 2013-09-15 DIAGNOSIS — I10 Essential (primary) hypertension: Secondary | ICD-10-CM

## 2013-09-15 MED ORDER — CEPHALEXIN 500 MG PO CAPS: 500.0000 mg | ORAL_CAPSULE | Freq: Two times a day (BID) | ORAL | Status: DC

## 2013-09-15 NOTE — Patient Instructions (Signed)
F/u as before  HBA1C today, and chem 7 and EGFR   Xray of left foot today and 5 day course of antibiotic.Marland Kitchen You wll be referred to a specialist after I see the xray reprot to help determine why you are suddenly having lack of movement and weakness in the left foot

## 2013-09-16 ENCOUNTER — Telehealth: Payer: Self-pay | Admitting: Family Medicine

## 2013-09-16 LAB — COMPLETE METABOLIC PANEL WITH GFR
ALT: 43 U/L — ABNORMAL HIGH (ref 0–35)
AST: 24 U/L (ref 0–37)
Albumin: 4.6 g/dL (ref 3.5–5.2)
Alkaline Phosphatase: 180 U/L — ABNORMAL HIGH (ref 39–117)
BUN: 13 mg/dL (ref 6–23)
CHLORIDE: 98 meq/L (ref 96–112)
CO2: 25 meq/L (ref 19–32)
CREATININE: 0.72 mg/dL (ref 0.50–1.10)
Calcium: 10.4 mg/dL (ref 8.4–10.5)
GFR, Est Non African American: 89 mL/min
Glucose, Bld: 286 mg/dL — ABNORMAL HIGH (ref 70–99)
Potassium: 4.1 mEq/L (ref 3.5–5.3)
Sodium: 138 mEq/L (ref 135–145)
Total Bilirubin: 0.4 mg/dL (ref 0.2–1.2)
Total Protein: 7.1 g/dL (ref 6.0–8.3)

## 2013-09-16 LAB — HEMOGLOBIN A1C
Hgb A1c MFr Bld: 7.5 % — ABNORMAL HIGH (ref <5.7)
MEAN PLASMA GLUCOSE: 169 mg/dL — AB (ref <117)

## 2013-09-16 NOTE — Telephone Encounter (Signed)
I ordered it yesterday but put a message for Dr to co-sign order

## 2013-09-21 NOTE — Assessment & Plan Note (Addendum)
Continues to follow closely with her rhematologist who has managed her disease for years

## 2013-09-21 NOTE — Assessment & Plan Note (Signed)
Not at goal, will change to maxzide next  Visit if systolic pressure still elevated DASH diet and commitment to daily physical activity for a minimum of 30 minutes discussed and encouraged, as a part of hypertension management. The importance of attaining a healthy weight is also discussed.

## 2013-09-21 NOTE — Assessment & Plan Note (Signed)
Uncontrolled. Hyperlipidemia:Low fat diet discussed and encouraged.  Updated lab needed at/ before next visit.  

## 2013-09-21 NOTE — Assessment & Plan Note (Signed)
Improved, pt applauded on this, no change in meds at this time Patient advised to reduce carb and sweets, commit to regular physical activity, take meds as prescribed, test blood as directed, and attempt to lose weight, to improve blood sugar control.

## 2013-09-21 NOTE — Assessment & Plan Note (Signed)
Acute left foot pain with reduced mobility and tenderness, short antinbiotic course and xrayu of foot , if not improved will be referred either to ppodiatry or neurology

## 2013-09-21 NOTE — Progress Notes (Signed)
   Subjective:    Patient ID: Stephanie Mays, female    DOB: August 07, 1950, 63 y.o.   MRN: [id number]  HPI Pt in with 2 week h/o left foot pain with reduced ability to bend her toes on the affected foot. There has been no inciting trauma. States she was seen recently in the ED with right groin pain and now  Has developed this new pain. Otherwise she has been well. Reports good blood sugars, and states her rheumatroid disease is generally well controlled   Review of Systems See HPI Denies recent fever or chills. Denies sinus pressure, nasal congestion, ear pain or sore throat. Denies chest congestion, productive cough or wheezing. Denies chest pains, palpitations and leg swelling Denies abdominal pain, nausea, vomiting,diarrhea or constipation.   Denies dysuria, frequency, hesitancy or incontinence.  Denies headaches, seizures, numbness, or tingling. Denies depression, anxiety or insomnia. Denies skin break down or rash.        Objective:   Physical Exam BP 146/82  Pulse 100  Resp 18  Ht 5' 2.5" (1.588 m)  Wt 140 lb 1.9 oz (63.558 kg)  BMI 25.20 kg/m2  SpO2 94% Patient alert and oriented and in no cardiopulmonary distress.  HEENT: No facial asymmetry, EOMI, no sinus tenderness,  oropharynx pink and moist.  Neck supple no adenopathy.  Chest: Clear to auscultation bilaterally.  CVS: S1, S2 no murmurs, no S3.  ABD: Soft non tender. Bowel sounds normal.  Ext: No edema  MS: Adequate ROM spine, shoulders, hips and knees.Reduced ROM left foot esp at MP jointswhich arre warm and tender  Skin: Intact, no ulcerations or rash noted.  Psych: Good eye contact, normal affect. Memory intact not anxious or depressed appearing.  CNS: CN 2-12 intact, power, tone and sensation normal throughout.        Assessment & Plan:  Foot pain, left Acute left foot pain with reduced mobility and tenderness, short antinbiotic course and xrayu of foot , if not improved will be referred either  to ppodiatry or neurology  Hyperlipidemia LDL goal < 100 Uncontrolled Hyperlipidemia:Low fat diet discussed and encouraged.  Updated lab needed at/ before next visit.   Diabetes mellitus, insulin dependent (IDDM), uncontrolled Improved, pt applauded on this, no change in meds at this time Patient advised to reduce carb and sweets, commit to regular physical activity, take meds as prescribed, test blood as directed, and attempt to lose weight, to improve blood sugar control.   HYPERTENSION Not at goal, will change to maxzide next  Visit if systolic pressure still elevated DASH diet and commitment to daily physical activity for a minimum of 30 minutes discussed and encouraged, as a part of hypertension management. The importance of attaining a healthy weight is also discussed.   ARTHRITIS, RHEUMATOID Continues to follow closely with her rhematologist who has managed her disease for years

## 2013-09-22 ENCOUNTER — Ambulatory Visit (HOSPITAL_COMMUNITY)
Admission: RE | Admit: 2013-09-22 | Discharge: 2013-09-22 | Disposition: A | Discharge status: Home / Self Care | Payer: Medicare Other | Source: Ambulatory Visit | Attending: Family Medicine | Admitting: Family Medicine

## 2013-09-22 ENCOUNTER — Other Ambulatory Visit: Payer: Self-pay | Admitting: Family Medicine

## 2013-09-22 DIAGNOSIS — M899 Disorder of bone, unspecified: Secondary | ICD-10-CM | POA: Insufficient documentation

## 2013-09-22 DIAGNOSIS — M8080XA Other osteoporosis with current pathological fracture, unspecified site, initial encounter for fracture: Secondary | ICD-10-CM

## 2013-09-22 DIAGNOSIS — S92902A Unspecified fracture of left foot, initial encounter for closed fracture: Secondary | ICD-10-CM

## 2013-09-22 DIAGNOSIS — M949 Disorder of cartilage, unspecified: Principal | ICD-10-CM

## 2013-09-22 DIAGNOSIS — S92309A Fracture of unspecified metatarsal bone(s), unspecified foot, initial encounter for closed fracture: Secondary | ICD-10-CM | POA: Insufficient documentation

## 2013-09-22 DIAGNOSIS — M19079 Primary osteoarthritis, unspecified ankle and foot: Secondary | ICD-10-CM | POA: Insufficient documentation

## 2013-09-22 DIAGNOSIS — M79672 Pain in left foot: Secondary | ICD-10-CM

## 2013-09-22 DIAGNOSIS — X58XXXA Exposure to other specified factors, initial encounter: Secondary | ICD-10-CM | POA: Insufficient documentation

## 2013-09-23 ENCOUNTER — Telehealth: Payer: Self-pay | Admitting: Family Medicine

## 2013-09-23 ENCOUNTER — Other Ambulatory Visit: Payer: Self-pay

## 2013-09-23 DIAGNOSIS — E1165 Type 2 diabetes mellitus with hyperglycemia: Principal | ICD-10-CM

## 2013-09-23 DIAGNOSIS — Z794 Long term (current) use of insulin: Principal | ICD-10-CM

## 2013-09-23 DIAGNOSIS — E785 Hyperlipidemia, unspecified: Secondary | ICD-10-CM

## 2013-09-23 DIAGNOSIS — IMO0001 Reserved for inherently not codable concepts without codable children: Secondary | ICD-10-CM

## 2013-09-23 NOTE — Telephone Encounter (Signed)
Noted, thank you

## 2013-09-26 ENCOUNTER — Other Ambulatory Visit: Payer: Self-pay

## 2013-09-26 DIAGNOSIS — IMO0001 Reserved for inherently not codable concepts without codable children: Secondary | ICD-10-CM

## 2013-09-26 DIAGNOSIS — E1165 Type 2 diabetes mellitus with hyperglycemia: Principal | ICD-10-CM

## 2013-09-26 DIAGNOSIS — Z794 Long term (current) use of insulin: Principal | ICD-10-CM

## 2013-10-04 ENCOUNTER — Encounter (HOSPITAL_COMMUNITY): Payer: Self-pay | Admitting: Emergency Medicine

## 2013-10-04 ENCOUNTER — Emergency Department (HOSPITAL_COMMUNITY): Payer: Medicare Other

## 2013-10-04 ENCOUNTER — Emergency Department (HOSPITAL_COMMUNITY)
Admission: EM | Admit: 2013-10-04 | Discharge: 2013-10-05 | Disposition: A | Discharge status: Home / Self Care | Payer: Medicare Other | Attending: Emergency Medicine | Admitting: Emergency Medicine

## 2013-10-04 DIAGNOSIS — I10 Essential (primary) hypertension: Secondary | ICD-10-CM | POA: Insufficient documentation

## 2013-10-04 DIAGNOSIS — Z792 Long term (current) use of antibiotics: Secondary | ICD-10-CM | POA: Insufficient documentation

## 2013-10-04 DIAGNOSIS — Z7983 Long term (current) use of bisphosphonates: Secondary | ICD-10-CM | POA: Insufficient documentation

## 2013-10-04 DIAGNOSIS — E785 Hyperlipidemia, unspecified: Secondary | ICD-10-CM | POA: Insufficient documentation

## 2013-10-04 DIAGNOSIS — Z7982 Long term (current) use of aspirin: Secondary | ICD-10-CM | POA: Insufficient documentation

## 2013-10-04 DIAGNOSIS — Z79899 Other long term (current) drug therapy: Secondary | ICD-10-CM | POA: Insufficient documentation

## 2013-10-04 DIAGNOSIS — D649 Anemia, unspecified: Secondary | ICD-10-CM | POA: Insufficient documentation

## 2013-10-04 DIAGNOSIS — Z90710 Acquired absence of both cervix and uterus: Secondary | ICD-10-CM | POA: Insufficient documentation

## 2013-10-04 DIAGNOSIS — Z794 Long term (current) use of insulin: Secondary | ICD-10-CM | POA: Insufficient documentation

## 2013-10-04 DIAGNOSIS — K439 Ventral hernia without obstruction or gangrene: Secondary | ICD-10-CM

## 2013-10-04 DIAGNOSIS — E119 Type 2 diabetes mellitus without complications: Secondary | ICD-10-CM | POA: Insufficient documentation

## 2013-10-04 DIAGNOSIS — M069 Rheumatoid arthritis, unspecified: Secondary | ICD-10-CM | POA: Insufficient documentation

## 2013-10-04 DIAGNOSIS — G709 Myoneural disorder, unspecified: Secondary | ICD-10-CM | POA: Insufficient documentation

## 2013-10-04 DIAGNOSIS — IMO0002 Reserved for concepts with insufficient information to code with codable children: Secondary | ICD-10-CM | POA: Insufficient documentation

## 2013-10-04 MED ORDER — ONDANSETRON HCL 4 MG/2ML IJ SOLN
4.0000 mg | Freq: Once | INTRAMUSCULAR | Status: AC
  Administered 2013-10-05: 4 mg via INTRAVENOUS
  Filled 2013-10-04: qty 2

## 2013-10-04 MED ORDER — SODIUM CHLORIDE 0.9 % IJ SOLN
INTRAMUSCULAR | Status: AC
  Filled 2013-10-04: qty 250

## 2013-10-04 MED ORDER — SODIUM CHLORIDE 0.9 % IV BOLUS (SEPSIS)
1000.0000 mL | Freq: Once | INTRAVENOUS | Status: AC
Start: 2013-10-05 — End: 2013-10-05
  Administered 2013-10-05: 1000 mL via INTRAVENOUS

## 2013-10-04 MED ORDER — MORPHINE SULFATE 4 MG/ML IJ SOLN
4.0000 mg | Freq: Once | INTRAMUSCULAR | Status: AC
  Administered 2013-10-05: 4 mg via INTRAVENOUS
  Filled 2013-10-04: qty 1

## 2013-10-04 MED ORDER — IOHEXOL 300 MG/ML  SOLN: 50.0000 mL | Freq: Once | INTRAMUSCULAR | Status: AC | PRN

## 2013-10-04 NOTE — ED Provider Notes (Signed)
CSN: [id number]     Arrival date & time 10/04/13  2147 History  This chart was scribed for Julianne Rice, MD by Zettie Pho, ED Scribe. This patient was seen in room APA16A/[facility]16A and the patient's care was started at 11:40 PM.    Chief Complaint  Patient presents with  . Abdominal Pain   The history is provided by the patient. No language interpreter was used.   HPI Comments: Stephanie Mays is a 63 y.o. female with a history of abdominal hysterectomy, colonoscopy, and hernia (per patient) who presents to the Emergency Department complaining of a constant pain to the periumbilical region of the abdomen with associated nausea and decreased appetite onset this morning. She reports some associated obstipation, but that she has been able to pass a small amount of gas today. Patient reports that she has experienced similar pain in the past, but that her current pain is more severe and the other symptoms are new for her. She denies emesis, diarrhea, constipation, fever, chills, dysuria, urinary frequency, vaginal bleeding. Patient also has a history of DM, HTN, hyperlipidemia, and neuromuscular disorder.   Past Medical History  Diagnosis Date  . Anemia   . Diabetes mellitus   . Hypertension   . Hyperlipidemia   . Arthritis 1999    rheumatoid  . RA (rheumatoid arthritis)   . Neuromuscular disorder    Past Surgical History  Procedure Laterality Date  . Abdominal hysterectomy    . Colonoscopy  06/27/2011    Procedure: COLONOSCOPY;  Surgeon: Rogene Houston, MD;  Location: [facility];  Service: Endoscopy;  Laterality: N/A;  7:30   Family History  Problem Relation Age of Onset  . Cancer Father     throat  . Diabetes Father   . Hypertension Father   . Diabetes Sister   . Hypertension Sister   . Diabetes Sister   . Cancer Brother     colon  . Cancer Mother     leukemia   History  Substance Use Topics  . Smoking status: Never Smoker   . Smokeless tobacco: Never Used  . Alcohol Use:  No   OB History   Grav Para Term Preterm Abortions TAB SAB Ect Mult Living                 Review of Systems  Constitutional: Positive for appetite change (decreased). Negative for fever and chills.  Respiratory: Negative for shortness of breath.   Cardiovascular: Negative for chest pain.  Gastrointestinal: Positive for nausea, abdominal pain and abdominal distention. Negative for vomiting, diarrhea and constipation.  Genitourinary: Negative for dysuria, frequency and vaginal discharge.  Musculoskeletal: Negative for back pain, myalgias, neck pain and neck stiffness.  Skin: Negative for rash and wound.  Neurological: Negative for dizziness, weakness, light-headedness and numbness.  All other systems reviewed and are negative.   Allergies  Review of patient's allergies indicates no known allergies.  Home Medications   Current Outpatient Rx  Name  Route  Sig  Dispense  Refill  . ACCU-CHEK AVIVA PLUS test strip      TEST ONCE DAILY AS DIRECTED.   50 each   3   . ACCU-CHEK FASTCLIX LANCETS MISC      TESTING ONCE DAILY.   102 each   2   . alendronate (FOSAMAX) 70 MG tablet      TAKE 1 TAB EACH WEEK 30 MIN PRIOR TO BREAKFAST WITH LARGE GLASS OF WATER. REMAIN UPRIGHT.   4 tablet  2   . amLODipine-valsartan (EXFORGE) 10-320 MG per tablet      TAKE 1 TABLET EVERY DAY.   30 tablet   3   . aspirin 81 MG tablet   Oral   Take 81 mg by mouth daily.           . Calcium Citrate-Vitamin D (CITRACAL + D PO)   Oral   Take 1 tablet by mouth daily.           . cephALEXin (KEFLEX) 500 MG capsule   Oral   Take 1 capsule (500 mg total) by mouth 2 (two) times daily.   14 capsule   0   . Flaxseed, Linseed, (FLAX SEED OIL PO)   Oral   Take 1 tablet by mouth daily.           . folic acid (FOLVITE) 1 MG tablet      TAKE 1 TABLET EVERY DAY.   30 tablet   3   . glipiZIDE (GLUCOTROL) 10 MG tablet      TAKE (1) TABLET BY MOUTH TWICE DAILY BEFORE A MEAL.   60  tablet   3   . hydrochlorothiazide (HYDRODIURIL) 25 MG tablet      TAKE ONE TABLET BY MOUTH ONCE DAILY.   30 tablet   3   . HYDROcodone-acetaminophen (NORCO) 7.5-325 MG per tablet   Oral   Take 1 tablet by mouth every 4 (four) hours as needed for moderate pain.   20 tablet   0   . ibuprofen (ADVIL,MOTRIN) 400 MG tablet   Oral   Take 1 tablet (400 mg total) by mouth every 6 (six) hours as needed.   30 tablet   0   . Insulin Glargine (LANTUS SOLOSTAR) 100 UNIT/ML SOPN      15 units once daily. Dose increase effective 07/01/2013   5 pen   3     Dose increase effective 07/01/2013   . methotrexate (RHEUMATREX) 2.5 MG tablet   Oral   Take 20 mg by mouth once a week. 8 tabs once weekly. On Friday         . metoprolol succinate (TOPROL-XL) 100 MG 24 hr tablet      TAKE ONE TABLET BY MOUTH ONCE DAILY.   30 tablet   3   . Multiple Vitamin (MULTIVITAMIN) tablet   Oral   Take 1 tablet by mouth daily.           . naproxen (NAPROSYN) 500 MG tablet   Oral   Take 500 mg by mouth as needed. Pain         . pravastatin (PRAVACHOL) 20 MG tablet   Oral   Take 1 tablet (20 mg total) by mouth daily.   30 tablet   3   . predniSONE (DELTASONE) 5 MG tablet   Oral   Take 5 mg by mouth daily.         . Saxagliptin-Metformin (KOMBIGLYZE XR) 2.11-998 MG TB24      TAKE 1 TABLET BY MOUTH TWICE A DAY WITH FOOD FOR DIABETES.   60 tablet   3    Triage Vitals: Pulse 90  Temp(Src) 99.6 F (37.6 C) (Oral)  Resp 16  Ht 5\' 2"  (1.575 m)  Wt 148 lb (67.132 kg)  BMI 27.06 kg/m2  SpO2 95%  Physical Exam  Nursing note and vitals reviewed. Constitutional: She is oriented to person, place, and time. She appears well-developed and well-nourished. No distress.  HENT:  Head: Normocephalic and atraumatic.  Mouth/Throat: Oropharynx is clear and moist.  Eyes: EOM are normal. Pupils are equal, round, and reactive to light.  Neck: Normal range of motion. Neck supple.   Cardiovascular: Normal rate and regular rhythm.   Pulmonary/Chest: Effort normal and breath sounds normal. No respiratory distress. She has no wheezes. She has no rales.  Abdominal: Soft. Bowel sounds are normal. She exhibits distension and mass. There is tenderness. There is no rebound and no guarding.  Patient has a periumbilical hernia it is difficult to reduce. She's high-pitched bowel sounds located in the hernia. It is tender to palpation. She has no rebound or guarding.  Musculoskeletal: Normal range of motion. She exhibits no edema and no tenderness.  Neurological: She is alert and oriented to person, place, and time.  Moves all extremities without deficit. Sensation is grossly intact.  Skin: Skin is warm and dry. No rash noted. No erythema.  Psychiatric: She has a normal mood and affect. Her behavior is normal.    ED Course  Procedures (including critical care time)  DIAGNOSTIC STUDIES: Oxygen Saturation is 95% on room air, normal by my interpretation.    COORDINATION OF CARE: 11:44 PM- Discussed that symptoms are likely due to her hernia protruding. Will apply ice to the area. Will order a CT of the abdomen. Discussed treatment plan with patient at bedside and patient verbalized agreement.     Labs Review Labs Reviewed  URINALYSIS, ROUTINE W REFLEX MICROSCOPIC   Imaging Review No results found.   EKG Interpretation None      MDM   Final diagnoses:  None   I personally performed the services described in this documentation, which was scribed in my presence. The recorded information has been reviewed and is accurate.   Attempted to reduce patient's hernia in emergency department. Unsure of success of reduction attempt. She still continues to have pain at the site of the umbilicus. Discussed with Dr. Arnoldo Morale. States with normal labs he doubts ischemia. Patient is feeling better after IV fluids and morphine. Dr. Jeneen Rinks will see the patient in the emergency department  and help with disposition.  Julianne Rice, MD 10/06/13 0000

## 2013-10-04 NOTE — ED Notes (Signed)
Pain lower right quad since 8am. No vomiting, no diarrhea

## 2013-10-05 LAB — COMPREHENSIVE METABOLIC PANEL
ALK PHOS: 146 U/L — AB (ref 39–117)
ALT: 38 U/L — ABNORMAL HIGH (ref 0–35)
AST: 22 U/L (ref 0–37)
Albumin: 4.1 g/dL (ref 3.5–5.2)
BUN: 14 mg/dL (ref 6–23)
CHLORIDE: 96 meq/L (ref 96–112)
CO2: 24 mEq/L (ref 19–32)
CREATININE: 0.55 mg/dL (ref 0.50–1.10)
Calcium: 10.4 mg/dL (ref 8.4–10.5)
GFR calc Af Amer: >90 mL/min (ref >90)
GFR calc non Af Amer: >90 mL/min (ref >90)
GLUCOSE: 226 mg/dL — AB (ref 70–99)
Potassium: 3.7 mEq/L (ref 3.7–5.3)
Sodium: 137 mEq/L (ref 137–147)
Total Bilirubin: 0.6 mg/dL (ref 0.3–1.2)
Total Protein: 7 g/dL (ref 6.0–8.3)

## 2013-10-05 LAB — CBC WITH DIFFERENTIAL/PLATELET
BASOS PCT: 0 % (ref 0–1)
Basophils Absolute: 0 10*3/uL (ref 0.0–0.1)
Eosinophils Absolute: 0 10*3/uL (ref 0.0–0.7)
Eosinophils Relative: 0 % (ref 0–5)
HEMATOCRIT: 41.9 % (ref 36.0–46.0)
HEMOGLOBIN: 14 g/dL (ref 12.0–15.0)
LYMPHS ABS: 1 10*3/uL (ref 0.7–4.0)
Lymphocytes Relative: 10 % — ABNORMAL LOW (ref 12–46)
MCH: 31.2 pg (ref 26.0–34.0)
MCHC: 33.4 g/dL (ref 30.0–36.0)
MCV: 93.3 fL (ref 78.0–100.0)
MONO ABS: 1.1 10*3/uL — AB (ref 0.1–1.0)
MONOS PCT: 11 % (ref 3–12)
NEUTROS ABS: 7.7 10*3/uL (ref 1.7–7.7)
NEUTROS PCT: 79 % — AB (ref 43–77)
Platelets: 284 10*3/uL (ref 150–400)
RBC: 4.49 MIL/uL (ref 3.87–5.11)
RDW: 14.9 % (ref 11.5–15.5)
WBC: 9.9 10*3/uL (ref 4.0–10.5)

## 2013-10-05 LAB — URINALYSIS, ROUTINE W REFLEX MICROSCOPIC
BILIRUBIN URINE: NEGATIVE
GLUCOSE, UA: 500 mg/dL — AB
Hgb urine dipstick: NEGATIVE
KETONES UR: 15 mg/dL — AB
Leukocytes, UA: NEGATIVE
NITRITE: NEGATIVE
PH: 6.5 (ref 5.0–8.0)
Protein, ur: NEGATIVE mg/dL
SPECIFIC GRAVITY, URINE: 1.015 (ref 1.005–1.030)
Urobilinogen, UA: 0.2 mg/dL (ref 0.0–1.0)

## 2013-10-05 LAB — LIPASE, BLOOD: LIPASE: 55 U/L (ref 11–59)

## 2013-10-05 LAB — I-STAT CG4 LACTIC ACID, ED: LACTIC ACID, VENOUS: 1.94 mmol/L (ref 0.5–2.2)

## 2013-10-05 MED ORDER — MORPHINE SULFATE 4 MG/ML IJ SOLN
4.0000 mg | Freq: Once | INTRAMUSCULAR | Status: AC
Start: 2013-10-05 — End: 2013-10-05
  Administered 2013-10-05: 4 mg via INTRAVENOUS

## 2013-10-05 MED ORDER — HYDROCODONE-ACETAMINOPHEN 5-325 MG PO TABS: 2.0000 | ORAL_TABLET | ORAL | Status: DC | PRN

## 2013-10-05 MED ORDER — IOHEXOL 300 MG/ML  SOLN
50.0000 mL | Freq: Once | INTRAMUSCULAR | Status: AC | PRN
  Administered 2013-10-05: 50 mL via ORAL

## 2013-10-05 MED ORDER — IOHEXOL 300 MG/ML  SOLN
100.0000 mL | Freq: Once | INTRAMUSCULAR | Status: AC | PRN
  Administered 2013-10-05: 100 mL via INTRAVENOUS

## 2013-10-05 MED ORDER — MORPHINE SULFATE 4 MG/ML IJ SOLN
INTRAMUSCULAR | Status: AC
  Administered 2013-10-05: 4 mg via INTRAVENOUS
  Filled 2013-10-05: qty 1

## 2013-10-05 NOTE — Discharge Instructions (Signed)
Hernia A hernia occurs when an internal organ pushes out through a weak spot in the abdominal wall. Hernias most commonly occur in the groin and around the navel. Hernias often can be pushed back into place (reduced). Most hernias tend to get worse over time. Some abdominal hernias can get stuck in the opening (irreducible or incarcerated hernia) and cannot be reduced. An irreducible abdominal hernia which is tightly squeezed into the opening is at risk for impaired blood supply (strangulated hernia). A strangulated hernia is a medical emergency. Because of the risk for an irreducible or strangulated hernia, surgery may be recommended to repair a hernia. CAUSES   Heavy lifting.  Prolonged coughing.  Straining to have a bowel movement.  A cut (incision) made during an abdominal surgery. HOME CARE INSTRUCTIONS   Bed rest is not required. You may continue your normal activities.  Avoid lifting more than 10 pounds (4.5 kg) or straining.  Cough gently. If you are a smoker it is best to stop. Even the best hernia repair can break down with the continual strain of coughing. Even if you do not have your hernia repaired, a cough will continue to aggravate the problem.  Do not wear anything tight over your hernia. Do not try to keep it in with an outside bandage or truss. These can damage abdominal contents if they are trapped within the hernia sac.  Eat a normal diet.  Avoid constipation. Straining over long periods of time will increase hernia size and encourage breakdown of repairs. If you cannot do this with diet alone, stool softeners may be used. SEEK IMMEDIATE MEDICAL CARE IF:   You have a fever.  You develop increasing abdominal pain.  You feel nauseous or vomit.  Your hernia is stuck outside the abdomen, looks discolored, feels hard, or is tender.  You have any changes in your bowel habits or in the hernia that are unusual for you.  You have increased pain or swelling around the  hernia.  You cannot push the hernia back in place by applying gentle pressure while lying down. MAKE SURE YOU:   Understand these instructions.  Will watch your condition.  Will get help right away if you are not doing well or get worse. Document Released: 07/10/2005 Document Revised: 10/02/2011 Document Reviewed: 02/27/2008 [employer] Patient Information 2014 [employer].  

## 2013-10-05 NOTE — ED Provider Notes (Signed)
Assumed from Dr. Lita Mains. Patient seen in emergency room by Dr. Aviva Signs of general surgery. Hernia reduced. Plan:  DC, limited Rx vicoden # 20  Tanna Furry, MD 10/05/13 [phone]

## 2013-10-05 NOTE — Consult Note (Signed)
Reason for Consult: Incarcerated incisional hernia Referring Physician: ER  SHARONICA KRASZEWSKI is an 63 y.o. female.  HPI: Patient is a 63 year old black female with multiple medical problems who presents with abdominal pain and swelling. She denies any nausea or vomiting. Her last bowel movement was a day or 2 ago. CT scan of the abdomen revealed multiple incisional hernias with one hernia containing transverse colon. She states that she has been referred to surgery in [facility] for further evaluation treatment. She does have a hernia for some time, but this is the first time she has had a problem.  The ER physician did reduce the hernias, though he wanted surgery to evaluate the tube complexity. Currently, the patient denies abdominal pain.  Past Medical History  Diagnosis Date  . Anemia   . Diabetes mellitus   . Hypertension   . Hyperlipidemia   . Arthritis 1999    rheumatoid  . RA (rheumatoid arthritis)   . Neuromuscular disorder     Past Surgical History  Procedure Laterality Date  . Abdominal hysterectomy    . Colonoscopy  06/27/2011    Procedure: COLONOSCOPY;  Surgeon: Rogene Houston, MD;  [facility];  Service: Endoscopy;  Laterality: N/A;  7:30    Family History  Problem Relation Age of Onset  . Cancer Father     throat  . Diabetes Father   . Hypertension Father   . Diabetes Sister   . Hypertension Sister   . Diabetes Sister   . Cancer Brother     colon  . Cancer Mother     leukemia    Social History:  reports that she has never smoked. She has never used smokeless tobacco. She reports that she does not drink alcohol or use illicit drugs.  Allergies: No Known Allergies  Medications: I have reviewed the patient's current medications.  Results for orders placed during the hospital encounter of 10/04/13 (from the past 48 hour(s))  CBC WITH DIFFERENTIAL     Status: Abnormal   Collection Time    10/04/13 11:55 PM      Result Value Ref Range   WBC 9.9   4.0 - 10.5 K/uL   RBC 4.49  3.87 - 5.11 MIL/uL   Hemoglobin 14.0  12.0 - 15.0 g/dL   HCT 41.9  36.0 - 46.0 %   MCV 93.3  78.0 - 100.0 fL   MCH 31.2  26.0 - 34.0 pg   MCHC 33.4  30.0 - 36.0 g/dL   RDW 14.9  11.5 - 15.5 %   Platelets 284  150 - 400 K/uL   Neutrophils Relative % 79 (*) 43 - 77 %   Neutro Abs 7.7  1.7 - 7.7 K/uL   Lymphocytes Relative 10 (*) 12 - 46 %   Lymphs Abs 1.0  0.7 - 4.0 K/uL   Monocytes Relative 11  3 - 12 %   Monocytes Absolute 1.1 (*) 0.1 - 1.0 K/uL   Eosinophils Relative 0  0 - 5 %   Eosinophils Absolute 0.0  0.0 - 0.7 K/uL   Basophils Relative 0  0 - 1 %   Basophils Absolute 0.0  0.0 - 0.1 K/uL  COMPREHENSIVE METABOLIC PANEL     Status: Abnormal   Collection Time    10/04/13 11:55 PM      Result Value Ref Range   Sodium 137  137 - 147 mEq/L   Potassium 3.7  3.7 - 5.3 mEq/L   Chloride 96  96 - 112 mEq/L   CO2 24  19 - 32 mEq/L   Glucose, Bld 226 (*) 70 - 99 mg/dL   BUN 14  6 - 23 mg/dL   Creatinine, Ser 0.55  0.50 - 1.10 mg/dL   Calcium 10.4  8.4 - 10.5 mg/dL   Total Protein 7.0  6.0 - 8.3 g/dL   Albumin 4.1  3.5 - 5.2 g/dL   AST 22  0 - 37 U/L   ALT 38 (*) 0 - 35 U/L   Alkaline Phosphatase 146 (*) 39 - 117 U/L   Total Bilirubin 0.6  0.3 - 1.2 mg/dL   GFR calc non Af Amer >90  >90 mL/min   GFR calc Af Amer >90  >90 mL/min   Comment: (NOTE)     The eGFR has been calculated using the CKD EPI equation.     This calculation has not been validated in all clinical situations.     eGFR's persistently <90 mL/min signify possible Chronic Kidney     Disease.  LIPASE, BLOOD     Status: None   Collection Time    10/04/13 11:55 PM      Result Value Ref Range   Lipase 55  11 - 59 U/L  URINALYSIS, ROUTINE W REFLEX MICROSCOPIC     Status: Abnormal   Collection Time    10/05/13 12:09 AM      Result Value Ref Range   Color, Urine YELLOW  YELLOW   APPearance CLEAR  CLEAR   Specific Gravity, Urine 1.015  1.005 - 1.030   pH 6.5  5.0 - 8.0   Glucose, UA 500  (*) NEGATIVE mg/dL   Hgb urine dipstick NEGATIVE  NEGATIVE   Bilirubin Urine NEGATIVE  NEGATIVE   Ketones, ur 15 (*) NEGATIVE mg/dL   Protein, ur NEGATIVE  NEGATIVE mg/dL   Urobilinogen, UA 0.2  0.0 - 1.0 mg/dL   Nitrite NEGATIVE  NEGATIVE   Leukocytes, UA NEGATIVE  NEGATIVE   Comment: MICROSCOPIC NOT DONE ON URINES WITH NEGATIVE PROTEIN, BLOOD, LEUKOCYTES, NITRITE, OR GLUCOSE <1000 mg/dL.  I-STAT CG4 LACTIC ACID, ED     Status: None   Collection Time    10/05/13 12:25 AM      Result Value Ref Range   Lactic Acid, Venous 1.94  0.5 - 2.2 mmol/L    Ct Abdomen Pelvis W Contrast  10/05/2013   CLINICAL DATA:  Periumbilical abdominal pain and nausea. Previous hysterectomy.  EXAM: CT ABDOMEN AND PELVIS WITH CONTRAST  TECHNIQUE: Multidetector CT imaging of the abdomen and pelvis was performed using the standard protocol following bolus administration of intravenous contrast.  CONTRAST:  131m OMNIPAQUE IOHEXOL 300 MG/ML SOLN, 549mOMNIPAQUE IOHEXOL 300 MG/ML SOLN  COMPARISON:  05/17/2011.  FINDINGS: A small umbilical hernia containing fat is again demonstrated.  Immediately Inferior to this small umbilical hernia, the previously demonstrated right ventral hernia containing fat demonstrates interval herniation of a portion of the transverse colon. There is mild diffuse wall thickening and enhancement as the colon passes through the hernia defect into the hernia sac. The colon leading into this defect is mildly dilated and filled with stool. The colon distal to the hernia is small in caliber, without stool. There is some stool in rectum. There is an interval small amount of fluid within this hernia sac.  At medially inferior to the hernia containing interval colon, a larger hernia is again demonstrated containing the transverse colon leading into the middle hernia.  No small bowel  dilatation. No enlarged lymph nodes. Normal appearing appendix. Stable diffuse low density of the liver relative to the spleen.  Normal appearing spleen, pancreas, adrenal glands, kidneys and urinary bladder. Surgically absent uterus. No adnexal masses or enlarged lymph nodes. Distended gallbladder. Mild atheromatous coronary artery calcifications. Minimal dependent atelectasis at both lung bases.  Interval healing right inferior and superior pubic ramus fractures. No significant change in old bilateral L5 pars interarticularis defects with associated grade 2 spondylolisthesis at the L5-S1 level. There is partial bone fusion at that level.  IMPRESSION: 1. Small umbilical hernia with 2 previously demonstrated larger hernias inferior to the umbilicus. The middle hernia demonstrate interval herniated transverse colon, causing at least partial obstruction of the colon. There is mild associated edema within the hernia sac. 2. Transverse colon continues to extend into the larger, most inferior hernia sac without obstruction. 3. Interval healing right superior and inferior pubic ramus fractures. 4. 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 5. Stable diffuse hepatic steatosis. 6. Mild atheromatous coronary artery calcifications.   Electronically Signed   By: Enrique Sack M.D.   On: 10/05/2013 02:09    ROS: See chart Blood pressure 121/86, pulse 72, temperature 98.6 F (37 C), temperature source Oral, resp. rate 20, height _0  (1.575 m), weight 67.132 kg (148 lb), SpO2 96.00%. Physical Exam: Pleasant white female in no acute distress. Abdomen is soft with multiple incisional hernias present. The primary hernia of concern is reducible. No tenderness or rigidity is noted.  Assessment/Plan: Impression: Incisional hernia, multiple. One hernia that was incarcerated has resolved. Plan: Patient may be discharged home from the emergency room. She was encouraged to followup with the surgeon in [place].  Anton Cheramie A 10/05/2013, 7:54 AM

## 2013-10-05 NOTE — ED Notes (Signed)
Pt resting quietly with eyes closed.  Respirations regular, even and unlabored.

## 2013-10-14 ENCOUNTER — Other Ambulatory Visit (HOSPITAL_COMMUNITY): Payer: Self-pay | Admitting: Orthopedic Surgery

## 2013-10-14 DIAGNOSIS — M21372 Foot drop, left foot: Secondary | ICD-10-CM

## 2013-10-14 DIAGNOSIS — R94131 Abnormal electromyogram [EMG]: Secondary | ICD-10-CM

## 2013-10-15 ENCOUNTER — Ambulatory Visit: Payer: Medicare Other | Admitting: Family Medicine

## 2013-10-16 ENCOUNTER — Ambulatory Visit (HOSPITAL_COMMUNITY)
Admission: RE | Admit: 2013-10-16 | Discharge: 2013-10-16 | Disposition: A | Discharge status: Home / Self Care | Payer: Medicare Other | Source: Ambulatory Visit | Attending: Orthopedic Surgery | Admitting: Orthopedic Surgery

## 2013-10-16 DIAGNOSIS — M7120 Synovial cyst of popliteal space [Baker], unspecified knee: Secondary | ICD-10-CM | POA: Insufficient documentation

## 2013-10-16 DIAGNOSIS — R94131 Abnormal electromyogram [EMG]: Secondary | ICD-10-CM

## 2013-10-16 DIAGNOSIS — M7640 Tibial collateral bursitis [Pellegrini-Stieda], unspecified leg: Secondary | ICD-10-CM | POA: Insufficient documentation

## 2013-10-16 DIAGNOSIS — M21372 Foot drop, left foot: Secondary | ICD-10-CM

## 2013-10-16 DIAGNOSIS — M259 Joint disorder, unspecified: Secondary | ICD-10-CM | POA: Insufficient documentation

## 2013-10-16 DIAGNOSIS — M216X9 Other acquired deformities of unspecified foot: Secondary | ICD-10-CM | POA: Insufficient documentation

## 2013-10-16 DIAGNOSIS — M898X9 Other specified disorders of bone, unspecified site: Secondary | ICD-10-CM | POA: Insufficient documentation

## 2013-10-21 ENCOUNTER — Encounter: Payer: Self-pay | Admitting: Neurology

## 2013-10-23 ENCOUNTER — Ambulatory Visit (INDEPENDENT_AMBULATORY_CARE_PROVIDER_SITE_OTHER): Payer: Medicare Other | Admitting: Neurology

## 2013-10-23 ENCOUNTER — Encounter: Payer: Self-pay | Admitting: Neurology

## 2013-10-23 ENCOUNTER — Encounter (INDEPENDENT_AMBULATORY_CARE_PROVIDER_SITE_OTHER): Payer: Self-pay

## 2013-10-23 VITALS — BP 150/90 | HR 91 | Ht 62.5 in | Wt 140.0 lb

## 2013-10-23 DIAGNOSIS — E1149 Type 2 diabetes mellitus with other diabetic neurological complication: Secondary | ICD-10-CM

## 2013-10-23 DIAGNOSIS — M21372 Foot drop, left foot: Secondary | ICD-10-CM | POA: Insufficient documentation

## 2013-10-23 DIAGNOSIS — M216X9 Other acquired deformities of unspecified foot: Secondary | ICD-10-CM

## 2013-10-23 HISTORY — DX: Foot drop, left foot: M21.372

## 2013-10-23 NOTE — Patient Instructions (Signed)

## 2013-10-23 NOTE — Progress Notes (Signed)
Reason for visit: Footdrop  IYSIS Mays is a 63 y.o. female  History of present illness:  Stephanie Mays is a 74 year old right-handed white female with a history of diabetes and a diabetic peripheral neuropathy. The patient developed a footdrop on the left around 6 weeks ago. The patient also has a history of rheumatoid arthritis. The patient has had some numbness in the left foot and leg, without any significant discomfort. The patient denies any numbness in the feet prior to the footdrop. The patient denies back pain or pain down the leg. The patient has not had any falls, but she has started using a cane for ambulation since the onset of the footdrop. She denies any neck pain or pain down arms. The patient not had any problems controlling the bowels or the bladder. The patient is being set up for an AFO brace to be placed in the near future. EMG and nerve conduction studies were done, and this revealed evidence of a significant left peroneal neuropathy at the fibular head, and evidence of a generalized peripheral neuropathy. EMG evaluation did not show evidence of a lumbosacral radiculopathy.  Past Medical History  Diagnosis Date  . Anemia   . Diabetes mellitus   . Hypertension   . Hyperlipidemia   . Arthritis 1999    rheumatoid  . RA (rheumatoid arthritis)   . Neuromuscular disorder   . Foot drop, left 10/23/2013    Past Surgical History  Procedure Laterality Date  . Abdominal hysterectomy    . Colonoscopy  06/27/2011    Procedure: COLONOSCOPY;  Surgeon: Rogene Houston, MD;  [facility];  Service: Endoscopy;  Laterality: N/A;  7:30    Family History  Problem Relation Age of Onset  . Cancer Father     throat  . Diabetes Father   . Hypertension Father   . Diabetes Sister   . Hypertension Sister   . Diabetes Sister   . Cancer Brother     colon  . Cancer Mother     leukemia    Social history:  reports that she has never smoked. She has never used smokeless  tobacco. She reports that she does not drink alcohol or use illicit drugs.  Medications:  Current Outpatient Prescriptions on File Prior to Visit  Medication Sig Dispense Refill  . ACCU-CHEK AVIVA PLUS test strip TEST ONCE DAILY AS DIRECTED.  50 each  3  . ACCU-CHEK FASTCLIX LANCETS MISC TESTING ONCE DAILY.  102 each  2  . alendronate (FOSAMAX) 70 MG tablet TAKE 1 TAB EACH WEEK 30 MIN PRIOR TO BREAKFAST WITH LARGE GLASS OF WATER. REMAIN UPRIGHT.  4 tablet  2  . amLODipine-valsartan (EXFORGE) 10-320 MG per tablet TAKE 1 TABLET EVERY DAY.  30 tablet  3  . aspirin 81 MG tablet Take 81 mg by mouth daily.        . Calcium Citrate-Vitamin D (CITRACAL + D PO) Take 1 tablet by mouth daily.        . Flaxseed, Linseed, (FLAX SEED OIL PO) Take 1 tablet by mouth daily.        . folic acid (FOLVITE) 1 MG tablet TAKE 1 TABLET EVERY DAY.  30 tablet  3  . glipiZIDE (GLUCOTROL) 10 MG tablet TAKE (1) TABLET BY MOUTH TWICE DAILY BEFORE A MEAL.  60 tablet  3  . hydrochlorothiazide (HYDRODIURIL) 25 MG tablet TAKE ONE TABLET BY MOUTH ONCE DAILY.  30 tablet  3  . HYDROcodone-acetaminophen (NORCO/VICODIN)  5-325 MG per tablet Take 2 tablets by mouth every 4 (four) hours as needed.  20 tablet  0  . Insulin Glargine (LANTUS SOLOSTAR) 100 UNIT/ML SOPN 15 units once daily. Dose increase effective 07/01/2013  5 pen  3  . methotrexate (RHEUMATREX) 2.5 MG tablet Take 20 mg by mouth once a week. 8 tabs once weekly. On Friday      . metoprolol succinate (TOPROL-XL) 100 MG 24 hr tablet TAKE ONE TABLET BY MOUTH ONCE DAILY.  30 tablet  3  . Multiple Vitamin (MULTIVITAMIN) tablet Take 1 tablet by mouth daily.        . pravastatin (PRAVACHOL) 20 MG tablet Take 1 tablet (20 mg total) by mouth daily.  30 tablet  3  . predniSONE (DELTASONE) 5 MG tablet Take 5 mg by mouth daily.      . Saxagliptin-Metformin (KOMBIGLYZE XR) 2.11-998 MG TB24 TAKE 1 TABLET BY MOUTH TWICE A DAY WITH FOOD FOR DIABETES.  60 tablet  3   No current  facility-administered medications on file prior to visit.     No Known Allergies  ROS:  Out of a complete 14 system review of symptoms, the patient complains only of the following symptoms, and all other reviewed systems are negative.  Joint swelling Numbness, weakness  Blood pressure 150/90, pulse 91, height 5' 2.5" (1.588 m), weight 140 lb (63.504 kg).  Physical Exam  General: The patient is alert and cooperative at the time of the examination.  Eyes: Pupils are equal, round, and reactive to light. Discs are flat bilaterally.  Neck: The neck is supple, no carotid bruits are noted.  Respiratory: The respiratory examination is clear.  Cardiovascular: The cardiovascular examination reveals a regular rate and rhythm, no obvious murmurs or rubs are noted.  Skin: Extremities are without significant edema.  Neurologic Exam  Mental status: The patient is alert and oriented x 3 at the time of the examination. The patient has apparent normal recent and remote memory, with an apparently normal attention span and concentration ability.  Cranial nerves: Facial symmetry is present. There is good sensation of the face to pinprick and soft touch bilaterally. The strength of the facial muscles and the muscles to head turning and shoulder shrug are normal bilaterally. Speech is well enunciated, no aphasia or dysarthria is noted. Extraocular movements are full. Visual fields are full. The tongue is midline, and the patient has symmetric elevation of the soft palate. No obvious hearing deficits are noted.  Motor: The motor testing reveals 5 over 5 strength of all 4 extremities, with exception that the patient has weakness with dorsiflexion, inversion, and eversion of the left foot. Good symmetric motor tone is noted throughout.  Sensory: Sensory testing is intact to pinprick, soft touch, vibration sensation, and position sense on all 4 extremities, with exception that vibration sensation, position  sensation, and pinprick sensation are decreased below the knee on the left leg. No definite stocking pattern pinprick sensory deficit is noted on the right leg. No evidence of extinction is noted.  Coordination: Cerebellar testing reveals good finger-nose-finger and heel-to-shin bilaterally.  Gait and station: Gait is associated with a steppage gait pattern with the left leg. The patient walks with a cane. Tandem gait was not performed. Romberg is negative. No drift is seen.  Reflexes: Deep tendon reflexes are symmetric and normal bilaterally, with exception that the ankle jerk reflexes are depressed bilaterally.. Toes are downgoing bilaterally.   Assessment/Plan:  1. Diabetes  2. Diabetic peripheral neuropathy  3. Left peroneal neuropathy  The patient has developed onset of a left sided footdrop that may be related to diabetes or to the rheumatoid arthritis. Further blood will be done looking for other treatable causes of peripheral neuropathy. The patient will followup through this office if needed, the patient does not have any discomfort in the feet at night, and there is no indication for medical therapy.  Jill Alexanders MD 10/23/2013 9:17 PM  [facility] [address] [place], [place] [address]  Phone [phone] Fax [phone]

## 2013-10-27 ENCOUNTER — Telehealth: Payer: Self-pay | Admitting: Neurology

## 2013-10-27 ENCOUNTER — Other Ambulatory Visit (HOSPITAL_COMMUNITY): Payer: Medicare Other

## 2013-10-27 LAB — IFE AND PE, SERUM
ALPHA 1: 0.2 g/dL (ref 0.1–0.4)
ALPHA2 GLOB SERPL ELPH-MCNC: 0.7 g/dL (ref 0.4–1.2)
Albumin SerPl Elph-Mcnc: 4.1 g/dL (ref 3.2–5.6)
Albumin/Glob SerPl: 1.7 (ref 0.7–2.0)
B-GLOBULIN SERPL ELPH-MCNC: 1 g/dL (ref 0.6–1.3)
GLOBULIN, TOTAL: 2.5 g/dL (ref 2.0–4.5)
Gamma Glob SerPl Elph-Mcnc: 0.6 g/dL (ref 0.5–1.6)
IgA/Immunoglobulin A, Serum: 187 mg/dL (ref 91–414)
IgG (Immunoglobin G), Serum: 721 mg/dL (ref 700–1600)
IgM (Immunoglobulin M), Srm: 44 mg/dL (ref 40–230)
TOTAL PROTEIN: 6.6 g/dL (ref 6.0–8.5)

## 2013-10-27 LAB — TSH: TSH: 0.518 u[IU]/mL (ref 0.450–4.500)

## 2013-10-27 LAB — VITAMIN B12: Vitamin B-12: 212 pg/mL (ref 211–946)

## 2013-10-27 LAB — ANGIOTENSIN CONVERTING ENZYME: Angio Convert Enzyme: 20 U/L (ref 14–82)

## 2013-10-27 NOTE — Telephone Encounter (Signed)
I called the patient. The patient had unremarkable blood work with exception that the vitamin B12 level is at the lower end of the therapeutic range. The patient is to go on vitamin B12 supplementation taking 1000 mcg daily.

## 2013-10-29 ENCOUNTER — Encounter (HOSPITAL_COMMUNITY): Payer: Medicare Other | Attending: Hematology and Oncology

## 2013-10-29 DIAGNOSIS — D509 Iron deficiency anemia, unspecified: Secondary | ICD-10-CM | POA: Insufficient documentation

## 2013-10-29 LAB — CBC WITH DIFFERENTIAL/PLATELET
BASOS ABS: 0 10*3/uL (ref 0.0–0.1)
Basophils Relative: 0 % (ref 0–1)
Eosinophils Absolute: 0.1 10*3/uL (ref 0.0–0.7)
Eosinophils Relative: 1 % (ref 0–5)
HEMATOCRIT: 40 % (ref 36.0–46.0)
HEMOGLOBIN: 13 g/dL (ref 12.0–15.0)
LYMPHS ABS: 2 10*3/uL (ref 0.7–4.0)
LYMPHS PCT: 23 % (ref 12–46)
MCH: 31.7 pg (ref 26.0–34.0)
MCHC: 32.5 g/dL (ref 30.0–36.0)
MCV: 97.6 fL (ref 78.0–100.0)
MONO ABS: 0.7 10*3/uL (ref 0.1–1.0)
Monocytes Relative: 8 % (ref 3–12)
NEUTROS ABS: 5.7 10*3/uL (ref 1.7–7.7)
Neutrophils Relative %: 67 % (ref 43–77)
Platelets: 270 10*3/uL (ref 150–400)
RBC: 4.1 MIL/uL (ref 3.87–5.11)
RDW: 15.1 % (ref 11.5–15.5)
WBC: 8.5 10*3/uL (ref 4.0–10.5)

## 2013-10-29 LAB — IRON AND TIBC
Iron: 51 ug/dL (ref 42–135)
SATURATION RATIOS: 18 % — AB (ref 20–55)
TIBC: 287 ug/dL (ref 250–470)
UIBC: 236 ug/dL (ref 125–400)

## 2013-10-29 LAB — FERRITIN: FERRITIN: 180 ng/mL (ref 10–291)

## 2013-10-29 NOTE — Progress Notes (Signed)
Labs drawn today for cbc/diff,Iron and IBC,ferr 

## 2013-11-12 ENCOUNTER — Other Ambulatory Visit: Payer: Self-pay | Admitting: Family Medicine

## 2013-11-29 ENCOUNTER — Other Ambulatory Visit: Payer: Self-pay | Admitting: Family Medicine

## 2013-12-07 ENCOUNTER — Other Ambulatory Visit: Payer: Self-pay | Admitting: Family Medicine

## 2013-12-08 ENCOUNTER — Other Ambulatory Visit: Payer: Self-pay | Admitting: Family Medicine

## 2013-12-10 LAB — COMPLETE METABOLIC PANEL WITH GFR
ALK PHOS: 100 U/L (ref 39–117)
ALT: 51 U/L — ABNORMAL HIGH (ref 0–35)
AST: 29 U/L (ref 0–37)
Albumin: 4.2 g/dL (ref 3.5–5.2)
BUN: 12 mg/dL (ref 6–23)
CO2: 30 mEq/L (ref 19–32)
Calcium: 9.8 mg/dL (ref 8.4–10.5)
Chloride: 103 mEq/L (ref 96–112)
Creat: 0.7 mg/dL (ref 0.50–1.10)
GFR, Est African American: >89 mL/min
GFR, Est Non African American: >89 mL/min
Glucose, Bld: 80 mg/dL (ref 70–99)
POTASSIUM: 4 meq/L (ref 3.5–5.3)
Sodium: 141 mEq/L (ref 135–145)
Total Bilirubin: 0.3 mg/dL (ref 0.2–1.2)
Total Protein: 6.1 g/dL (ref 6.0–8.3)

## 2013-12-10 LAB — HEMOGLOBIN A1C
Hgb A1c MFr Bld: 7.5 % — ABNORMAL HIGH (ref <5.7)
MEAN PLASMA GLUCOSE: 169 mg/dL — AB (ref <117)

## 2013-12-10 LAB — LIPID PANEL
CHOL/HDL RATIO: 3.1 ratio
CHOLESTEROL: 180 mg/dL (ref 0–200)
HDL: 58 mg/dL (ref >39)
LDL Cholesterol: 91 mg/dL (ref 0–99)
TRIGLYCERIDES: 157 mg/dL — AB (ref <150)
VLDL: 31 mg/dL (ref 0–40)

## 2013-12-16 ENCOUNTER — Encounter: Payer: Self-pay | Admitting: Family Medicine

## 2013-12-16 ENCOUNTER — Ambulatory Visit (INDEPENDENT_AMBULATORY_CARE_PROVIDER_SITE_OTHER): Payer: Medicare Other | Admitting: Family Medicine

## 2013-12-16 ENCOUNTER — Encounter (INDEPENDENT_AMBULATORY_CARE_PROVIDER_SITE_OTHER): Payer: Self-pay

## 2013-12-16 VITALS — BP 126/78 | HR 68 | Resp 18 | Ht 62.5 in | Wt 141.0 lb

## 2013-12-16 DIAGNOSIS — M069 Rheumatoid arthritis, unspecified: Secondary | ICD-10-CM

## 2013-12-16 DIAGNOSIS — Z1231 Encounter for screening mammogram for malignant neoplasm of breast: Secondary | ICD-10-CM

## 2013-12-16 DIAGNOSIS — M949 Disorder of cartilage, unspecified: Secondary | ICD-10-CM

## 2013-12-16 DIAGNOSIS — E119 Type 2 diabetes mellitus without complications: Principal | ICD-10-CM

## 2013-12-16 DIAGNOSIS — IMO0002 Reserved for concepts with insufficient information to code with codable children: Secondary | ICD-10-CM

## 2013-12-16 DIAGNOSIS — E109 Type 1 diabetes mellitus without complications: Secondary | ICD-10-CM

## 2013-12-16 DIAGNOSIS — I10 Essential (primary) hypertension: Secondary | ICD-10-CM

## 2013-12-16 DIAGNOSIS — IMO0001 Reserved for inherently not codable concepts without codable children: Secondary | ICD-10-CM

## 2013-12-16 DIAGNOSIS — Z794 Long term (current) use of insulin: Principal | ICD-10-CM

## 2013-12-16 DIAGNOSIS — K432 Incisional hernia without obstruction or gangrene: Secondary | ICD-10-CM

## 2013-12-16 DIAGNOSIS — M899 Disorder of bone, unspecified: Secondary | ICD-10-CM

## 2013-12-16 DIAGNOSIS — E1165 Type 2 diabetes mellitus with hyperglycemia: Secondary | ICD-10-CM

## 2013-12-16 DIAGNOSIS — E1065 Type 1 diabetes mellitus with hyperglycemia: Secondary | ICD-10-CM

## 2013-12-16 MED ORDER — ALENDRONATE SODIUM 70 MG PO TABS: ORAL_TABLET | ORAL | Status: DC

## 2013-12-16 MED ORDER — AMLODIPINE BESYLATE-VALSARTAN 10-320 MG PO TABS: ORAL_TABLET | ORAL | Status: DC

## 2013-12-16 MED ORDER — METOPROLOL SUCCINATE ER 100 MG PO TB24: ORAL_TABLET | ORAL | Status: DC

## 2013-12-16 MED ORDER — SAXAGLIPTIN-METFORMIN ER 2.5-1000 MG PO TB24: ORAL_TABLET | ORAL | Status: DC

## 2013-12-16 MED ORDER — HYDROCHLOROTHIAZIDE 25 MG PO TABS: ORAL_TABLET | ORAL | Status: DC

## 2013-12-16 MED ORDER — GLIPIZIDE 10 MG PO TABS: ORAL_TABLET | ORAL | Status: DC

## 2013-12-16 NOTE — Patient Instructions (Addendum)
Annual physical exam in October, call if you ned me before  Congrats on good labs, cut back on cheese and fried food  And red meat, no changes in medication  You wll be referred for bone density test also to Dr Redmond Pulling for hernia repair   Your next mammogram is due Sept 3 or afte  Non fasting labs, for October visit, CMP and EGFR and hBA1C

## 2014-01-01 ENCOUNTER — Ambulatory Visit (INDEPENDENT_AMBULATORY_CARE_PROVIDER_SITE_OTHER): Payer: Medicare Other | Admitting: General Surgery

## 2014-01-15 ENCOUNTER — Ambulatory Visit (INDEPENDENT_AMBULATORY_CARE_PROVIDER_SITE_OTHER): Payer: Medicare Other | Admitting: General Surgery

## 2014-01-15 ENCOUNTER — Encounter (INDEPENDENT_AMBULATORY_CARE_PROVIDER_SITE_OTHER): Payer: Self-pay | Admitting: General Surgery

## 2014-01-15 VITALS — BP 130/80 | HR 92 | Temp 98.6°F | Resp 16 | Ht 63.0 in | Wt 140.8 lb

## 2014-01-15 DIAGNOSIS — K430 Incisional hernia with obstruction, without gangrene: Secondary | ICD-10-CM | POA: Insufficient documentation

## 2014-01-15 NOTE — Patient Instructions (Signed)
Laparoscopic Ventral Hernia Repair °Laparoscopic ventral hernia repair is a surgery to fix a ventral hernia. A ventral hernia, also called an incisional hernia, is a bulge of body tissue or intestines that pushes through the front part of the abdomen. This can happen if the connective tissue covering the muscles over the abdomen has a weak spot or is torn because of a surgical cut (incision) from a previous surgery. Laparoscopic ventral hernia repair is often done soon after diagnosis to stop the hernia from getting bigger, becoming uncomfortable, or becoming an emergency. This surgery usually takes about 2 hours, but the time can vary greatly. °LET YOUR HEALTH CARE PROVIDER KNOW ABOUT: °· Any allergies you have. °· All medicines you are taking, including steroids, vitamins, herbs, eye drops, creams, and over-the-counter medicines. °· Previous problems you or members of your family have had with the use of anesthetics. °· Any blood disorders you have. °· Previous surgeries you have had. °· Medical conditions you have. °RISKS AND COMPLICATIONS  °Generally, laparoscopic ventral hernia repair is a safe procedure. However, as with any surgical procedure, problems can occur. Possible problems include: °· Bleeding. °· Trouble passing urine or having a bowel movement after the surgery. °· Infection. °· Pneumonia. °· Blood clots. °· Pain in the area of the hernia. °· A bulge in the area of the hernia that may be caused by a collection of fluid. °· Injury to intestines or other structures in the abdomen. °· Return of the hernia after surgery. °In some cases, your health care provider may need to stop the laparoscopic procedure and do regular, open surgery. This may be necessary for very difficult hernias, when organs are hard to see, or when bleeding problems occur during surgery. °BEFORE THE PROCEDURE  °· You may need to have blood tests, urine tests, a chest X-ray, or an electrocardiogram done before the day of the  surgery. °· Ask your health care provider about changing or stopping your regular medicines. This is especially important if you are taking diabetes medicines or blood thinners. °· You may need to wash with a special type of germ-killing soap. °· Do not eat or drink anything after midnight the night before the procedure or as directed by your health care provider. °· Make plans to have someone drive you home after the procedure. °PROCEDURE  °· Small monitors will be put on your body. They are used to check your heart, blood pressure, and oxygen level. °· An IV access tube will be put into a vein in your hand or arm. Fluids and medicine will flow directly into your body through the IV tube. °· You will be given medicine that makes you go to sleep (general anesthetic). °· Your abdomen will be cleaned with a special soap to kill any germs on your skin. °· Once you are asleep, several small incisions will be made in your abdomen. °· The large space in your abdomen will be filled with air so that it expands. This gives your health care provider more room and a better view. °· A thin, lighted tube with a tiny camera on the end (laparoscope) is put through a small incision in your abdomen. The camera on the laparoscope sends a picture to a TV screen in the operating room. This gives your health care provider a good view inside your abdomen. °· Hollow tubes are put through the other small incisions in your abdomen. The tools needed for the procedure are put through these tubes. °· Your health care provider   puts the tissue or intestines that formed the hernia back in place.  A screen-like patch (mesh) is used to close the hernia. This helps make the area stronger. Stitches, tacks, or staples are used to keep the mesh in place.  Medicine and a bandage (dressing) or skin glue will be put over the incisions. AFTER THE PROCEDURE   You will stay in a recovery area until the anesthetic wears off. Your blood pressure and  pulse will be checked often.  You may be able to go home the same day or may need to stay in the hospital for 1-2 days after surgery. Your health care provider will decide when you can go home.  You may feel some pain. You may be given medicine for pain.  You will be urged to do breathing exercises that involve taking deep breaths. This helps prevent a lung infection after a surgery.  You may have to wear compression stockings while you are in the hospital. These stockings help keep blood clots from forming in your legs. Document Released: 06/26/2012 Document Revised: 07/15/2013 Document Reviewed: 06/26/2012 [facility] Patient Information 2015 [place], [place]. This information is not intended to replace advice given to you by your health care provider. Make sure you discuss any questions you have with your health care provider.

## 2014-01-15 NOTE — Progress Notes (Signed)
Patient ID: Stephanie Mays, female   DOB: 01/12/1951, 63 y.o.   MRN: [id number]  Chief Complaint  Patient presents with  . Incisional Hernia    HPI Stephanie Mays is a 63 y.o. female.   HPI 63 year old African American female comes in because of pain now symptomatic incisional hernia. I initially met her on 04/11/2013. At that time we diagnosed an incisional hernia but she was asymptomatic at that time. She stated she had the hernia-bulge since around 2007. She has had a prior abdominal total hysterectomy. We discussed surgery at that time however she wanted to think about it. Apparently she is now symptomatic. She went to the emergency room in March with a partially incarcerated hernia but was able to be reduced and she was sent home from the emergency room. She underwent labs and a CT scan at that time. She states that she had a hard bulge, severe pain over the hernia site, as well as a lot of nausea. Since that time she has not had these symptoms but is afraid of recurrent symptoms. She denies any fever, chills. She has been evaluated recently by neurology because of a left foot drop which is felt to be probably a sequela of her diabetes. She denies any unplanned weight loss. She denies any melena or hematochezia. Past Medical History  Diagnosis Date  . Anemia   . Diabetes mellitus   . Hypertension   . Hyperlipidemia   . Arthritis 1999    rheumatoid  . RA (rheumatoid arthritis)   . Neuromuscular disorder   . Foot drop, left 10/23/2013    Past Surgical History  Procedure Laterality Date  . Abdominal hysterectomy    . Colonoscopy  06/27/2011    Procedure: COLONOSCOPY;  Surgeon: Rogene Houston, MD;  [facility];  Service: Endoscopy;  Laterality: N/A;  7:30    Family History  Problem Relation Age of Onset  . Cancer Father     throat  . Diabetes Father   . Hypertension Father   . Diabetes Sister   . Hypertension Sister   . Diabetes Sister   . Cancer Brother     colon  .  Cancer Mother     leukemia    Social History History  Substance Use Topics  . Smoking status: Never Smoker   . Smokeless tobacco: Never Used  . Alcohol Use: No    No Known Allergies  Current Outpatient Prescriptions  Medication Sig Dispense Refill  . ACCU-CHEK AVIVA PLUS test strip TEST ONCE DAILY AS DIRECTED.  50 each  3  . ACCU-CHEK FASTCLIX LANCETS MISC TESTING ONCE DAILY.  102 each  2  . alendronate (FOSAMAX) 70 MG tablet TAKE 1 TAB EACH WEEK 30 MIN PRIOR TO BREAKFAST WITH LARGE GLASS OF WATER. REMAIN UPRIGHT.  4 tablet  5  . amLODipine-valsartan (EXFORGE) 10-320 MG per tablet TAKE 1 TABLET EVERY DAY.  30 tablet  5  . aspirin 81 MG tablet Take 81 mg by mouth daily.        . Calcium Citrate-Vitamin D (CITRACAL + D PO) Take 1 tablet by mouth daily.        . Flaxseed, Linseed, (FLAX SEED OIL PO) Take 1 tablet by mouth daily.        . folic acid (FOLVITE) 1 MG tablet TAKE 1 TABLET EVERY DAY.  30 tablet  3  . glipiZIDE (GLUCOTROL) 10 MG tablet TAKE (1) TABLET BY MOUTH TWICE DAILY BEFORE A MEAL.  [place]  tablet  5  . hydrochlorothiazide (HYDRODIURIL) 25 MG tablet TAKE ONE TABLET BY MOUTH ONCE DAILY.  30 tablet  3  . HYDROcodone-acetaminophen (NORCO/VICODIN) 5-325 MG per tablet Take 2 tablets by mouth every 4 (four) hours as needed.  20 tablet  0  . Insulin Glargine (LANTUS SOLOSTAR) 100 UNIT/ML SOPN 15 units once daily. Dose increase effective 07/01/2013  5 pen  3  . methotrexate (RHEUMATREX) 2.5 MG tablet Take 20 mg by mouth once a week. 8 tabs once weekly. On Friday      . metoprolol succinate (TOPROL-XL) 100 MG 24 hr tablet TAKE ONE TABLET BY MOUTH ONCE DAILY.  30 tablet  5  . Multiple Vitamin (MULTIVITAMIN) tablet Take 1 tablet by mouth daily.        . pravastatin (PRAVACHOL) 20 MG tablet TAKE ONE TABLET BY MOUTH DAILY.  30 tablet  3  . predniSONE (DELTASONE) 5 MG tablet Take 5 mg by mouth daily.      . Saxagliptin-Metformin (KOMBIGLYZE XR) 2.11-998 MG TB24 TAKE 1 TABLET BY MOUTH TWICE  A DAY WITH FOOD FOR DIABETES.  60 tablet  5  . vitamin B-12 (CYANOCOBALAMIN) 1000 MCG tablet Take 1,000 mcg by mouth daily.       No current facility-administered medications for this visit.    Review of Systems Review of Systems  Constitutional: Negative for fever, activity change, appetite change and unexpected weight change.  HENT: Negative for nosebleeds and trouble swallowing.   Eyes: Negative for photophobia and visual disturbance.  Respiratory: Negative for chest tightness and shortness of breath.   Cardiovascular: Negative for chest pain and leg swelling.       Denies CP, SOB, orthopnea, PND, DOE  Gastrointestinal: Positive for nausea and abdominal pain. Negative for vomiting, diarrhea, constipation and blood in stool.  Genitourinary: Negative for dysuria and difficulty urinating.  Musculoskeletal: Negative for arthralgias.  Skin: Negative for pallor and rash.  Neurological: Negative for dizziness, seizures, facial asymmetry and numbness.       Denies TIA and amaurosis fugax   Hematological: Negative for adenopathy. Does not bruise/bleed easily.  Psychiatric/Behavioral: Negative for behavioral problems and agitation.    Blood pressure 130/80, pulse 92, temperature 98.6 F (37 C), resp. rate 16, height 5' 3"  (1.6 m), weight 140 lb 12.8 oz (63.866 kg).  Physical Exam Physical Exam  Vitals reviewed. Constitutional: She is oriented to person, place, and time. She appears well-developed and well-nourished. No distress.  HENT:  Head: Normocephalic and atraumatic.  Right Ear: External ear normal.  Left Ear: External ear normal.  Eyes: Conjunctivae are normal. No scleral icterus.  Neck: Normal range of motion. Neck supple. No tracheal deviation present. No thyromegaly present.  Cardiovascular: Normal rate and normal heart sounds.   Pulmonary/Chest: Effort normal and breath sounds normal. No stridor. No respiratory distress. She has no wheezes.  Abdominal: Soft. She exhibits no  distension. There is no tenderness. There is no rebound and no guarding.    Unable to completely reduce hernia. Can't tell exact fascial dimensions; soft, nt over hernia. No skin changes  Musculoskeletal: She exhibits no edema and no tenderness.  Lymphadenopathy:    She has no cervical adenopathy.  Neurological: She is alert and oriented to person, place, and time. She exhibits normal muscle tone.  Skin: Skin is warm and dry. No rash noted. She is not diaphoretic. No erythema.  Psychiatric: She has a normal mood and affect. Her behavior is normal. Judgment and thought content normal.  Data Reviewed My office note ED note 09/2013 CT scan March 20/15 she has what appears to be 3 abdominal wall defects starting at the level of her umbilicus extending inferiorly. She has a small umbilical hernia with a width of around 3.5 cm. Inferiorly to this she has another hernia containing transverse colon with the mouth of the hernia measuring 4 cm. Just inferior to this there is a third hernia with a measurement of around 4 cm defect. There appears to be 3 cm of intact fascia inferiorly below this down to the pubic  Assessment    Incarcerated incisional hernia IDDM RA on chronic prednisone     Plan    We rediscussed the etiology of ventral hernias. We discussed the signs and symptoms of  strangulation. The patient was given educational material. I also drew diagrams.  We discussed nonoperative and operative management. With respect to operative management, we discussed both open repair and laparoscopic repair. We discussed the pros and cons of each approach. I discussed the typical aftercare with each procedure and how each procedure differs.  Because she is presented to the emergency room with a case of incarceration and the fact that I cannot get her hernia to completely reduce, I have recommended with proceeding with surgery in the near future. I believe her risk of strangulation is higher than  it used to be. The patient is also in agreement with proceeding with surgery  The patient has elected to Laparoscopic repair of ventral incarcerated incisional hernia with mesh  We discussed the risk and benefits of surgery including but not limited to bleeding, infection, injury to surrounding structures, hernia recurrence, mesh complications, hematoma/seroma formation, need to convert to an open procedure, blood clot formation, urinary retention, post operative ileus, general anesthesia risk, long-term abdominal pain. We discussed that this procedure can be quite uncomfortable and difficult to recover from based on how the mesh is secured to the abdominal wall. We discussed the importance of avoiding heavy lifting and straining for a period of 6 weeks.  She'll be scheduled for surgery in the near future.  [name]. Redmond Pulling, MD, FACS General, Bariatric, & Minimally Invasive Surgery [facility], [place]          [facility] M 01/15/2014, 5:30 PM

## 2014-01-16 ENCOUNTER — Ambulatory Visit (HOSPITAL_COMMUNITY)
Admission: RE | Admit: 2014-01-16 | Discharge: 2014-01-16 | Disposition: A | Discharge status: Home / Self Care | Payer: Medicare Other | Source: Ambulatory Visit | Attending: Family Medicine | Admitting: Family Medicine

## 2014-01-16 DIAGNOSIS — M899 Disorder of bone, unspecified: Secondary | ICD-10-CM | POA: Insufficient documentation

## 2014-01-16 DIAGNOSIS — M949 Disorder of cartilage, unspecified: Principal | ICD-10-CM

## 2014-01-26 ENCOUNTER — Encounter (HOSPITAL_COMMUNITY): Payer: Self-pay | Admitting: Pharmacy Technician

## 2014-01-26 ENCOUNTER — Encounter (HOSPITAL_COMMUNITY): Payer: Medicare Other | Attending: Hematology and Oncology

## 2014-01-26 DIAGNOSIS — M069 Rheumatoid arthritis, unspecified: Secondary | ICD-10-CM | POA: Insufficient documentation

## 2014-01-26 DIAGNOSIS — D638 Anemia in other chronic diseases classified elsewhere: Secondary | ICD-10-CM | POA: Diagnosis present

## 2014-01-26 DIAGNOSIS — D509 Iron deficiency anemia, unspecified: Secondary | ICD-10-CM | POA: Diagnosis present

## 2014-01-26 DIAGNOSIS — E1165 Type 2 diabetes mellitus with hyperglycemia: Secondary | ICD-10-CM

## 2014-01-26 DIAGNOSIS — IMO0001 Reserved for inherently not codable concepts without codable children: Secondary | ICD-10-CM | POA: Insufficient documentation

## 2014-01-26 LAB — CBC WITH DIFFERENTIAL/PLATELET
Basophils Absolute: 0 K/uL (ref 0.0–0.1)
Basophils Relative: 0 % (ref 0–1)
Eosinophils Absolute: 0.2 K/uL (ref 0.0–0.7)
Eosinophils Relative: 2 % (ref 0–5)
HCT: 38.5 % (ref 36.0–46.0)
Hemoglobin: 12.6 g/dL (ref 12.0–15.0)
Lymphocytes Relative: 28 % (ref 12–46)
Lymphs Abs: 2 K/uL (ref 0.7–4.0)
MCH: 33.5 pg (ref 26.0–34.0)
MCHC: 32.7 g/dL (ref 30.0–36.0)
MCV: 102.4 fL — ABNORMAL HIGH (ref 78.0–100.0)
Monocytes Absolute: 0.5 K/uL (ref 0.1–1.0)
Monocytes Relative: 8 % (ref 3–12)
Neutro Abs: 4.3 K/uL (ref 1.7–7.7)
Neutrophils Relative %: 62 % (ref 43–77)
Platelets: 327 K/uL (ref 150–400)
RBC: 3.76 MIL/uL — ABNORMAL LOW (ref 3.87–5.11)
RDW: 15.4 % (ref 11.5–15.5)
WBC: 7.1 K/uL (ref 4.0–10.5)

## 2014-01-26 LAB — FERRITIN: Ferritin: 177 ng/mL (ref 10–291)

## 2014-01-26 NOTE — Progress Notes (Signed)
LABS DRAWN FOR [facility],FERR  

## 2014-01-28 ENCOUNTER — Encounter (HOSPITAL_BASED_OUTPATIENT_CLINIC_OR_DEPARTMENT_OTHER): Payer: Medicare Other

## 2014-01-28 ENCOUNTER — Encounter (HOSPITAL_COMMUNITY): Payer: Self-pay

## 2014-01-28 VITALS — BP 137/79 | HR 98 | Temp 98.7°F | Resp 18 | Wt 143.5 lb

## 2014-01-28 DIAGNOSIS — E1165 Type 2 diabetes mellitus with hyperglycemia: Secondary | ICD-10-CM

## 2014-01-28 DIAGNOSIS — D509 Iron deficiency anemia, unspecified: Secondary | ICD-10-CM

## 2014-01-28 DIAGNOSIS — M069 Rheumatoid arthritis, unspecified: Secondary | ICD-10-CM

## 2014-01-28 DIAGNOSIS — D638 Anemia in other chronic diseases classified elsewhere: Secondary | ICD-10-CM

## 2014-01-28 DIAGNOSIS — IMO0001 Reserved for inherently not codable concepts without codable children: Secondary | ICD-10-CM

## 2014-01-28 NOTE — Progress Notes (Signed)
[place]  OFFICE PROGRESS NOTE  Tula Nakayama, MD [address], [address] [place] [address]  DIAGNOSIS: Iron deficiency anemia, unspecified - Plan: CBC with Differential, Ferritin, CBC with Differential, Comprehensive metabolic panel, Ferritin, Sedimentation rate  Type II or unspecified type diabetes mellitus without mention of complication, uncontrolled - Plan: CBC with Differential, Comprehensive metabolic panel, Ferritin, Sedimentation rate  Rheumatoid arthritis(714.0) - Plan: CBC with Differential, Comprehensive metabolic panel, Ferritin, Sedimentation rate  Anemia of chronic disease - Plan: CBC with Differential, Comprehensive metabolic panel, Ferritin, Sedimentation rate  Chief Complaint  Patient presents with  . Mixed anemia    CURRENT THERAPY: IV Feraheme on 04/02/2012.  INTERVAL HISTORY: Stephanie Mays 63 y.o. female returns for followup of mixed anemia with contributions from iron deficiency as well as anemia of chronic disease in the setting of rheumatoid arthritis and its treatment.  Due to never enlarging right lower abdominal hernia, the patient is scheduled for surgery on 02/06/2014. Appetite has been good with no nausea, vomiting, diarrhea, constipation, melena, hematochezia, or incontinence. Left foot drop as developed for which he now wears a brace. She continues to use a cane as well. She denies any joint swelling or redness. She continues on prednisone and methotrexate. She denies any nasal drip, sore throat, cough, wheezing, easy satiety, skin rash, headache, or seizures.  MEDICAL HISTORY: Past Medical History  Diagnosis Date  . Anemia   . Diabetes mellitus   . Hypertension   . Hyperlipidemia   . Arthritis 1999    rheumatoid  . RA (rheumatoid arthritis)   . Neuromuscular disorder   . Foot drop, left 10/23/2013    INTERIM HISTORY: has Diabetes mellitus, insulin dependent (IDDM), uncontrolled; DIABETIC  PERIPHERAL NEUROPATHY; ANEMIA, IRON DEFICIENCY; HYPERTENSION; ARTHRITIS, RHEUMATOID; OSTEOPENIA; Hyperlipidemia LDL goal < 100; Cough; Foot pain, left; Foot drop, left; and Incarcerated incisional hernia on her problem list.    ALLERGIES:  has No Known Allergies.  MEDICATIONS: has a current medication list which includes the following prescription(s): accu-chek aviva plus, accu-chek fastclix lancets, alendronate, amlodipine-valsartan, aspirin ec, calcium citrate-vitamin d, flaxseed (linseed), folic acid, glipizide, hydrochlorothiazide, hydrocodone-acetaminophen, insulin glargine, methotrexate, metoprolol succinate, milk thistle, multivitamin with minerals, pravastatin, prednisone, saxagliptin-metformin, and vitamin b-12.  SURGICAL HISTORY:  Past Surgical History  Procedure Laterality Date  . Abdominal hysterectomy    . Colonoscopy  06/27/2011    Procedure: COLONOSCOPY;  Surgeon: Rogene Houston, MD;  Location: [facility];  Service: Endoscopy;  Laterality: N/A;  7:30    FAMILY HISTORY: family history includes Cancer in her brother, father, and mother; Diabetes in her father, sister, and sister; Hypertension in her father and sister.  SOCIAL HISTORY:  reports that she has never smoked. She has never used smokeless tobacco. She reports that she does not drink alcohol or use illicit drugs.  REVIEW OF SYSTEMS:  Other than that discussed above is noncontributory.  PHYSICAL EXAMINATION: ECOG PERFORMANCE STATUS: 1 - Symptomatic but completely ambulatory  Blood pressure 137/79, pulse 98, temperature 98.7 F (37.1 C), temperature source Oral, resp. rate 18, weight 143 lb 8 oz (65.091 kg).  GENERAL:alert, no distress and comfortable SKIN: skin color, texture, turgor are normal, no rashes or significant lesions EYES: PERLA; Conjunctiva are pink and non-injected, sclera clear SINUSES: No redness or tenderness over maxillary or ethmoid sinuses OROPHARYNX:no exudate, no erythema on lips, buccal  mucosa, or tongue. NECK: supple, thyroid normal size, non-tender, without nodularity. No masses CHEST:  Normal AP diameter with no breast masses. Stigmata of rheumatoid disease including LYMPH:  no palpable lymphadenopathy in the cervical, axillary or inguinal LUNGS: clear to auscultation and percussion with normal breathing effort HEART: regular rate & rhythm and no murmurs. ABDOMEN:abdomen soft, non-tender and normal bowel sounds. Right lower quadrant indirect hernia, easily reducible, measuring approximately 12 cm in diameter.  MUSCULOSKELETAL:no cyanosis of digits and no clubbing. Range of motion normal.  Stigmata of rheumatoid disease including Heberden's nodes in the PIPs of both hands, Bouchard's nodes as well as swelling of the PIP and MCP joints with slight ulnar deviation.  NEURO: alert & oriented x 3 with fluent speech, left foot drop.    LABORATORY DATA: Lab on 01/26/2014  Component Date Value Ref Range Status  . WBC 01/26/2014 7.1  4.0 - 10.5 K/uL Final  . RBC 01/26/2014 3.76* 3.87 - 5.11 MIL/uL Final  . Hemoglobin 01/26/2014 12.6  12.0 - 15.0 g/dL Final  . HCT 01/26/2014 38.5  36.0 - 46.0 % Final  . MCV 01/26/2014 102.4* 78.0 - 100.0 fL Final  . MCH 01/26/2014 33.5  26.0 - 34.0 pg Final  . MCHC 01/26/2014 32.7  30.0 - 36.0 g/dL Final  . RDW 01/26/2014 15.4  11.5 - 15.5 % Final  . Platelets 01/26/2014 327  150 - 400 K/uL Final  . Neutrophils Relative % 01/26/2014 62  43 - 77 % Final  . Neutro Abs 01/26/2014 4.3  1.7 - 7.7 K/uL Final  . Lymphocytes Relative 01/26/2014 28  12 - 46 % Final  . Lymphs Abs 01/26/2014 2.0  0.7 - 4.0 K/uL Final  . Monocytes Relative 01/26/2014 8  3 - 12 % Final  . Monocytes Absolute 01/26/2014 0.5  0.1 - 1.0 K/uL Final  . Eosinophils Relative 01/26/2014 2  0 - 5 % Final  . Eosinophils Absolute 01/26/2014 0.2  0.0 - 0.7 K/uL Final  . Basophils Relative 01/26/2014 0  0 - 1 % Final  . Basophils Absolute 01/26/2014 0.0  0.0 - 0.1 K/uL Final  .  Ferritin 01/26/2014 177  10 - 291 ng/mL Final   Performed at [place]: no new pathology.  Urinalysis    Component Value Date/Time   COLORURINE YELLOW 10/05/2013 0009   APPEARANCEUR CLEAR 10/05/2013 0009   LABSPEC 1.015 10/05/2013 0009   PHURINE 6.5 10/05/2013 0009   GLUCOSEU 500* 10/05/2013 0009   HGBUR NEGATIVE 10/05/2013 0009   HGBUR negative 04/28/2010 1309   BILIRUBINUR NEGATIVE 10/05/2013 0009   KETONESUR 15* 10/05/2013 0009   PROTEINUR NEGATIVE 10/05/2013 0009   UROBILINOGEN 0.2 10/05/2013 0009   NITRITE NEGATIVE 10/05/2013 0009   LEUKOCYTESUR NEGATIVE 10/05/2013 0009    RADIOGRAPHIC STUDIES: Dg Bone Density  01/16/2014   EXAM: DG DEXA BONE DENSITY STUDY  The Bone Mineral Densitometry hard-copy report (which includes all data, graphical display, and FRAX results when applicable) has been sent directly to the ordering physician.  This report can also be obtained electronically by viewing images for this exam through the performing facility's EMR, or by logging directly into [employer].   Electronically Signed   By: Rolm Baptise M.D.   On: 01/16/2014 12:04    ASSESSMENT:  1. Anemia of chronic disease with a low-normal TIBC and low % saturation.  2. Definite element of iron deficiency anemia with low serum iron less than 10 and non-calculable TIBC in the past. S/P Feraheme 1020 mg IV on 04/02/2012 with great response.  3. Anemia, secondary  to #1 and 2 in addition to treatment of #4 contributing to anemia with Methotrexate as directed by Rheumatologist.  4. Rheumatoid arthritis, on methotrexate. Managed by Dr. Ouida Sills (Rheumatologist) in [place].  5. Poorly controlled DM with Hgb A1C on 03/04/2012 of 8.1  6. HTN  7. Intolerance to PO iron tablets due to constipation, nausea. 8. Right lower quadrant hernia, easily reducible.     PLAN:  1. No hematologic contraindication to right hernia surgery, either open or laparoscopic. 2. Continue followup in 6 months  with CBC, chem profile, ferritin, and ESR.   All questions were answered. The patient knows to call the clinic with any problems, questions or concerns. We can certainly see the patient much sooner if necessary.   I spent 25 minutes counseling the patient face to face. The total time spent in the appointment was 30 minutes.    Doroteo Bradford, MD 01/28/2014 11:33 AM  DISCLAIMER:  This note was dictated with voice recognition software.  Similar sounding words can inadvertently be transcribed inaccurately and may not be corrected upon review.

## 2014-01-28 NOTE — Patient Instructions (Signed)
[facility] Discharge Instructions  RECOMMENDATIONS MADE BY THE CONSULTANT AND ANY TEST RESULTS WILL BE SENT TO YOUR REFERRING PHYSICIAN.  We will see you in 6 months for repeat lab work and a [place] visit.  Please call for any questions or concerns.  Thank you for choosing [facility] to provide your oncology and hematology care.  To afford each patient quality time with our providers, please arrive at least 15 minutes before your scheduled appointment time.  With your help, our goal is to use those 15 minutes to complete the necessary work-up to ensure our physicians have the information they need to help with your evaluation and healthcare recommendations.    Effective January 1st, 2014, we ask that you re-schedule your appointment with our physicians should you arrive 10 or more minutes late for your appointment.  We strive to give you quality time with our providers, and arriving late affects you and other patients whose appointments are after yours.    Again, thank you for choosing [facility].  Our hope is that these requests will decrease the amount of time that you wait before being seen by our physicians.       _____________________________________________________________  Should you have questions after your visit to [facility], please contact our office at [phone] between the hours of 8:30 a.m. and 4:30 p.m.  Voicemails left after 4:30 p.m. will not be returned until the following business day.  For prescription refill requests, have your pharmacy contact our office with your prescription refill request.    _______________________________________________________________  We hope that we have given you very good care.  You may receive a patient satisfaction survey in the mail, please complete it and return it as soon as possible.  We value your  feedback!  _______________________________________________________________  Have you asked about our STAR program?  STAR stands for Survivorship Training and Rehabilitation, and this is a nationally recognized cancer care program that focuses on survivorship and rehabilitation.  Cancer and cancer treatments may cause problems, such as, pain, making you feel tired and keeping you from doing the things that you need or want to do. Cancer rehabilitation can help. Our goal is to reduce these troubling effects and help you have the best quality of life possible.  You may receive a survey from a nurse that asks questions about your current state of health.  Based on the survey results, all eligible patients will be referred to the [facility] program for an evaluation so we can better serve you!  A frequently asked questions sheet is available upon request.

## 2014-01-29 ENCOUNTER — Encounter (HOSPITAL_COMMUNITY): Payer: Self-pay

## 2014-01-29 ENCOUNTER — Encounter (HOSPITAL_COMMUNITY)
Admission: RE | Admit: 2014-01-29 | Discharge: 2014-01-29 | Disposition: A | Discharge status: Home / Self Care | Payer: Medicare Other | Source: Ambulatory Visit | Attending: General Surgery | Admitting: General Surgery

## 2014-01-29 ENCOUNTER — Ambulatory Visit (HOSPITAL_COMMUNITY)
Admission: RE | Admit: 2014-01-29 | Discharge: 2014-01-29 | Disposition: A | Discharge status: Home / Self Care | Payer: Medicare Other | Source: Ambulatory Visit | Attending: Anesthesiology | Admitting: Anesthesiology

## 2014-01-29 DIAGNOSIS — Z01810 Encounter for preprocedural cardiovascular examination: Secondary | ICD-10-CM | POA: Insufficient documentation

## 2014-01-29 DIAGNOSIS — Z01812 Encounter for preprocedural laboratory examination: Secondary | ICD-10-CM | POA: Insufficient documentation

## 2014-01-29 DIAGNOSIS — Z01818 Encounter for other preprocedural examination: Secondary | ICD-10-CM | POA: Insufficient documentation

## 2014-01-29 HISTORY — DX: Personal history of other medical treatment: Z92.89

## 2014-01-29 HISTORY — DX: Other complications of anesthesia, initial encounter: T88.59XA

## 2014-01-29 HISTORY — DX: Adverse effect of unspecified anesthetic, initial encounter: T41.45XA

## 2014-01-29 LAB — COMPREHENSIVE METABOLIC PANEL
ALT: 54 U/L — AB (ref 0–35)
ANION GAP: 17 — AB (ref 5–15)
AST: 39 U/L — ABNORMAL HIGH (ref 0–37)
Albumin: 4.1 g/dL (ref 3.5–5.2)
Alkaline Phosphatase: 112 U/L (ref 39–117)
BUN: 15 mg/dL (ref 6–23)
CALCIUM: 10.5 mg/dL (ref 8.4–10.5)
CO2: 23 mEq/L (ref 19–32)
Chloride: 98 mEq/L (ref 96–112)
Creatinine, Ser: 0.62 mg/dL (ref 0.50–1.10)
GFR calc non Af Amer: >90 mL/min (ref >90)
Glucose, Bld: 289 mg/dL — ABNORMAL HIGH (ref 70–99)
Potassium: 5.1 mEq/L (ref 3.7–5.3)
SODIUM: 138 meq/L (ref 137–147)
TOTAL PROTEIN: 7.4 g/dL (ref 6.0–8.3)
Total Bilirubin: <0.2 mg/dL — ABNORMAL LOW (ref 0.3–1.2)

## 2014-01-29 LAB — CBC WITH DIFFERENTIAL/PLATELET
Basophils Absolute: 0 10*3/uL (ref 0.0–0.1)
Basophils Relative: 0 % (ref 0–1)
EOS ABS: 0 10*3/uL (ref 0.0–0.7)
EOS PCT: 0 % (ref 0–5)
HCT: 39.1 % (ref 36.0–46.0)
Hemoglobin: 12.7 g/dL (ref 12.0–15.0)
LYMPHS ABS: 0.6 10*3/uL — AB (ref 0.7–4.0)
Lymphocytes Relative: 6 % — ABNORMAL LOW (ref 12–46)
MCH: 33.1 pg (ref 26.0–34.0)
MCHC: 32.5 g/dL (ref 30.0–36.0)
MCV: 101.8 fL — AB (ref 78.0–100.0)
Monocytes Absolute: 0.5 10*3/uL (ref 0.1–1.0)
Monocytes Relative: 5 % (ref 3–12)
Neutro Abs: 8.4 10*3/uL — ABNORMAL HIGH (ref 1.7–7.7)
Neutrophils Relative %: 89 % — ABNORMAL HIGH (ref 43–77)
PLATELETS: 298 10*3/uL (ref 150–400)
RBC: 3.84 MIL/uL — AB (ref 3.87–5.11)
RDW: 15.5 % (ref 11.5–15.5)
WBC: 9.6 10*3/uL (ref 4.0–10.5)

## 2014-01-29 NOTE — Patient Instructions (Addendum)
NENA HAMPE  01/29/2014                           YOUR PROCEDURE IS SCHEDULED ON: 02/06/14 AT 2:40 AM               ENTER [place] ENTRANCE AND                            FOLLOW  SIGNS TO SHORT STAY CENTER                 ARRIVE AT SHORT STAY AT: 12:40 PM               CALL THIS NUMBER IF ANY PROBLEMS THE DAY OF SURGERY :               [phone]                                REMEMBER:   Do not eat food  AFTER MIDNIGHT   May have clear liquids UNTIL 6 HOURS BEFORE SURGERY (8:30 AM)               Take these medicines the morning of surgery with               A SIPS OF WATER :   PRAVASTATIN / PREDNISONE / HYDROCODONE IF NEEDED             TAKE 1/2 DOSE (7.5 UNITS) OF INSULIN THE NIGHT BEFORE SURGERY     Do not wear jewelry, make-up   Do not wear lotions, powders, or perfumes.   Do not shave legs or underarms 12 hrs. before surgery (men may shave face)  Do not bring valuables to the hospital.  Contacts, dentures or bridgework may not be worn into surgery.  Leave suitcase in the car. After surgery it may be brought to your room.  For patients admitted to the hospital more than one night, checkout time is            11:00 AM                                                       The day of discharge.   Patients discharged the day of surgery will not be allowed to drive home.            If going home same day of surgery, must have someone stay with you              FIRST 24 hrs at home and arrange for some one to drive you              home from hospital.   ________________________________________________________________________                                                             [place]  Foods Excluded  Coffee and tea, regular and decaf                             liquids that you cannot  Plain Jell-O in any flavor                                              see through such as: Fruit ices (not with fruit pulp)                                     milk, soups, orange juice  Iced Popsicles                                    All solid food Carbonated beverages, regular and diet                                    Cranberry, grape and apple juices Sports drinks like Gatorade Lightly seasoned clear broth or consume(fat free) Sugar, honey syrup  _____________________________________________________________________   STOP ASPIRIN AND HERBAL MEDS 5 DAYS PREOP _______________________________________________________________________                [place] - PREPARING FOR SURGERY  Before surgery, you can play an important role.  Because skin is not sterile, your skin needs to be as free of germs as possible.  You can reduce the number of germs on your skin by washing with CHG (chlorahexidine gluconate) soap before surgery.  CHG is an antiseptic cleaner which kills germs and bonds with the skin to continue killing germs even after washing. Please DO NOT use if you have an allergy to CHG or antibacterial soaps.  If your skin becomes reddened/irritated stop using the CHG and inform your nurse when you arrive at Short Stay. Do not shave (including legs and underarms) for at least 48 hours prior to the first CHG shower.  You may shave your face. Please follow these instructions carefully:   1.  Shower with CHG Soap the night before surgery and the  morning of Surgery.   2.  If you choose to wash your hair, wash your hair first as usual with your  normal  Shampoo.   3.  After you shampoo, rinse your hair and body thoroughly to remove the  shampoo.                                         4.  Use CHG as you would any other liquid soap.  You can apply chg directly  to the skin and wash . Gently wash with scrungie or clean wascloth    5.  Apply the CHG Soap to your body ONLY FROM THE NECK DOWN.   Do not use on open                            Wound or open sores. Avoid contact with eyes, ears mouth and genitals (private  parts).                        Genitals (private parts) with your normal soap.              6.  Wash thoroughly, paying special attention to the area where your surgery  will be performed.   7.  Thoroughly rinse your body with warm water from the neck down.   8.  DO NOT shower/wash with your normal soap after using and rinsing off  the CHG Soap .                9.  Pat yourself dry with a clean towel.             10.  Wear clean pajamas.             11.  Place clean sheets on your bed the night of your first shower and do not  sleep with pets.  Day of Surgery : Do not apply any lotions/deodorants the morning of surgery.  Please wear clean clothes to the hospital/surgery center.  FAILURE TO FOLLOW THESE INSTRUCTIONS MAY RESULT IN THE CANCELLATION OF YOUR SURGERY    PATIENT SIGNATURE_________________________________  ______________________________________________________________________

## 2014-01-30 NOTE — Progress Notes (Signed)
Abnormal CMET routed to Dr. Redmond Pulling

## 2014-02-05 NOTE — Progress Notes (Signed)
PT notified of surgery time change to 12:30 pm - instructed to arrive at Short Stay at 10:30 am - instructed NPO after midnight.

## 2014-02-06 ENCOUNTER — Encounter (HOSPITAL_COMMUNITY): Payer: Self-pay | Admitting: *Deleted

## 2014-02-06 ENCOUNTER — Ambulatory Visit (HOSPITAL_COMMUNITY): Payer: Medicare Other | Admitting: Registered Nurse

## 2014-02-06 ENCOUNTER — Encounter (HOSPITAL_COMMUNITY): Payer: Medicare Other | Admitting: Registered Nurse

## 2014-02-06 ENCOUNTER — Inpatient Hospital Stay (HOSPITAL_COMMUNITY)
Admission: RE | Admit: 2014-02-06 | Discharge: 2014-02-10 | DRG: 355 | Disposition: A | Discharge status: Home Health | Payer: Medicare Other | Source: Ambulatory Visit | Attending: General Surgery | Admitting: General Surgery

## 2014-02-06 ENCOUNTER — Encounter (HOSPITAL_COMMUNITY)
Admission: RE | Disposition: A | Discharge status: Home Health | Payer: Self-pay | Source: Ambulatory Visit | Attending: General Surgery

## 2014-02-06 DIAGNOSIS — I10 Essential (primary) hypertension: Secondary | ICD-10-CM | POA: Diagnosis present

## 2014-02-06 DIAGNOSIS — Z6825 Body mass index (BMI) 25.0-25.9, adult: Secondary | ICD-10-CM

## 2014-02-06 DIAGNOSIS — E119 Type 2 diabetes mellitus without complications: Secondary | ICD-10-CM

## 2014-02-06 DIAGNOSIS — E1149 Type 2 diabetes mellitus with other diabetic neurological complication: Secondary | ICD-10-CM | POA: Diagnosis present

## 2014-02-06 DIAGNOSIS — IMO0002 Reserved for concepts with insufficient information to code with codable children: Secondary | ICD-10-CM

## 2014-02-06 DIAGNOSIS — Z5331 Laparoscopic surgical procedure converted to open procedure: Secondary | ICD-10-CM

## 2014-02-06 DIAGNOSIS — E785 Hyperlipidemia, unspecified: Secondary | ICD-10-CM | POA: Diagnosis present

## 2014-02-06 DIAGNOSIS — Z833 Family history of diabetes mellitus: Secondary | ICD-10-CM

## 2014-02-06 DIAGNOSIS — E1142 Type 2 diabetes mellitus with diabetic polyneuropathy: Secondary | ICD-10-CM | POA: Diagnosis present

## 2014-02-06 DIAGNOSIS — K430 Incisional hernia with obstruction, without gangrene: Principal | ICD-10-CM | POA: Diagnosis present

## 2014-02-06 DIAGNOSIS — E669 Obesity, unspecified: Secondary | ICD-10-CM | POA: Diagnosis present

## 2014-02-06 DIAGNOSIS — E1165 Type 2 diabetes mellitus with hyperglycemia: Secondary | ICD-10-CM

## 2014-02-06 DIAGNOSIS — M069 Rheumatoid arthritis, unspecified: Secondary | ICD-10-CM | POA: Diagnosis present

## 2014-02-06 DIAGNOSIS — Z806 Family history of leukemia: Secondary | ICD-10-CM

## 2014-02-06 DIAGNOSIS — M216X9 Other acquired deformities of unspecified foot: Secondary | ICD-10-CM | POA: Diagnosis present

## 2014-02-06 DIAGNOSIS — Z8249 Family history of ischemic heart disease and other diseases of the circulatory system: Secondary | ICD-10-CM

## 2014-02-06 DIAGNOSIS — Z79899 Other long term (current) drug therapy: Secondary | ICD-10-CM

## 2014-02-06 DIAGNOSIS — M21372 Foot drop, left foot: Secondary | ICD-10-CM | POA: Diagnosis present

## 2014-02-06 DIAGNOSIS — IMO0001 Reserved for inherently not codable concepts without codable children: Secondary | ICD-10-CM | POA: Diagnosis present

## 2014-02-06 DIAGNOSIS — Z794 Long term (current) use of insulin: Secondary | ICD-10-CM

## 2014-02-06 HISTORY — PX: INCISIONAL HERNIA REPAIR: SHX193

## 2014-02-06 HISTORY — PX: INSERTION OF MESH: SHX5868

## 2014-02-06 LAB — GLUCOSE, CAPILLARY
GLUCOSE-CAPILLARY: 279 mg/dL — AB (ref 70–99)
Glucose-Capillary: 118 mg/dL — ABNORMAL HIGH (ref 70–99)
Glucose-Capillary: 199 mg/dL — ABNORMAL HIGH (ref 70–99)

## 2014-02-06 SURGERY — REPAIR, HERNIA, INCISIONAL, LAPAROSCOPIC
Anesthesia: General | Site: Abdomen

## 2014-02-06 MED ORDER — INSULIN ASPART 100 UNIT/ML ~~LOC~~ SOLN: 0.0000 [IU] | Freq: Three times a day (TID) | SUBCUTANEOUS | Status: DC

## 2014-02-06 MED ORDER — DEXAMETHASONE SODIUM PHOSPHATE 10 MG/ML IJ SOLN
INTRAMUSCULAR | Status: AC
  Filled 2014-02-06: qty 1

## 2014-02-06 MED ORDER — MORPHINE SULFATE (PF) 1 MG/ML IV SOLN
INTRAVENOUS | Status: DC
  Administered 2014-02-06: 4 mg via INTRAVENOUS
  Administered 2014-02-06: 16:00:00 via INTRAVENOUS
  Administered 2014-02-07: 1 mg via INTRAVENOUS
  Administered 2014-02-07: 8 mg via INTRAVENOUS
  Administered 2014-02-07: 13 mg via INTRAVENOUS
  Administered 2014-02-07: 19:00:00 via INTRAVENOUS
  Administered 2014-02-07: 18 mg via INTRAVENOUS
  Administered 2014-02-07: 8 mg via INTRAVENOUS
  Administered 2014-02-07: 12.63 mg via INTRAVENOUS
  Administered 2014-02-07: 9.55 mg via INTRAVENOUS
  Administered 2014-02-07: 08:00:00 via INTRAVENOUS
  Administered 2014-02-08: 5 mg via INTRAVENOUS
  Administered 2014-02-08: 3 mg via INTRAVENOUS
  Administered 2014-02-08: 10 mg via INTRAVENOUS
  Administered 2014-02-08: 17 mg via INTRAVENOUS
  Administered 2014-02-08 (×2): via INTRAVENOUS
  Administered 2014-02-08: 3 mg via INTRAVENOUS
  Administered 2014-02-08: 10.49 mg via INTRAVENOUS
  Administered 2014-02-09: 2 mg via INTRAVENOUS
  Administered 2014-02-09: 4 mg via INTRAVENOUS
  Filled 2014-02-06 (×5): qty 25

## 2014-02-06 MED ORDER — POTASSIUM CHLORIDE IN NACL 20-0.45 MEQ/L-% IV SOLN
INTRAVENOUS | Status: DC
  Administered 2014-02-06: 22:00:00 via INTRAVENOUS
  Administered 2014-02-07: 75 mL/h via INTRAVENOUS
  Administered 2014-02-08 – 2014-02-09 (×2): via INTRAVENOUS
  Filled 2014-02-06 (×9): qty 1000

## 2014-02-06 MED ORDER — METOPROLOL SUCCINATE ER 100 MG PO TB24
100.0000 mg | ORAL_TABLET | Freq: Every evening | ORAL | Status: DC
  Administered 2014-02-06 – 2014-02-09 (×4): 100 mg via ORAL
  Filled 2014-02-06 (×5): qty 1

## 2014-02-06 MED ORDER — INSULIN ASPART 100 UNIT/ML ~~LOC~~ SOLN
0.0000 [IU] | Freq: Three times a day (TID) | SUBCUTANEOUS | Status: DC
Start: 2014-02-06 — End: 2014-02-10
  Administered 2014-02-06: 3 [IU] via SUBCUTANEOUS
  Administered 2014-02-07: 5 [IU] via SUBCUTANEOUS
  Administered 2014-02-07: 2 [IU] via SUBCUTANEOUS
  Administered 2014-02-07 – 2014-02-08 (×3): 3 [IU] via SUBCUTANEOUS
  Administered 2014-02-08 – 2014-02-09 (×4): 5 [IU] via SUBCUTANEOUS
  Administered 2014-02-10 (×2): 3 [IU] via SUBCUTANEOUS

## 2014-02-06 MED ORDER — MIDAZOLAM HCL 5 MG/5ML IJ SOLN
INTRAMUSCULAR | Status: DC | PRN
  Administered 2014-02-06 (×2): 1 mg via INTRAVENOUS

## 2014-02-06 MED ORDER — CHLORHEXIDINE GLUCONATE 4 % EX LIQD: 1.0000 "application " | Freq: Once | CUTANEOUS | Status: DC

## 2014-02-06 MED ORDER — AMLODIPINE BESYLATE-VALSARTAN 10-320 MG PO TABS: 1.0000 | ORAL_TABLET | Freq: Every morning | ORAL | Status: DC

## 2014-02-06 MED ORDER — MIDAZOLAM HCL 2 MG/2ML IJ SOLN
INTRAMUSCULAR | Status: AC
  Filled 2014-02-06: qty 2

## 2014-02-06 MED ORDER — DIPHENHYDRAMINE HCL 50 MG/ML IJ SOLN: 12.5000 mg | Freq: Four times a day (QID) | INTRAMUSCULAR | Status: DC | PRN

## 2014-02-06 MED ORDER — KETOROLAC TROMETHAMINE 30 MG/ML IJ SOLN
INTRAMUSCULAR | Status: AC
  Filled 2014-02-06: qty 1

## 2014-02-06 MED ORDER — SAXAGLIPTIN-METFORMIN ER 2.5-1000 MG PO TB24: 1.0000 | ORAL_TABLET | Freq: Two times a day (BID) | ORAL | Status: DC

## 2014-02-06 MED ORDER — ROCURONIUM BROMIDE 100 MG/10ML IV SOLN
INTRAVENOUS | Status: DC | PRN
  Administered 2014-02-06: 10 mg via INTRAVENOUS
  Administered 2014-02-06: 40 mg via INTRAVENOUS

## 2014-02-06 MED ORDER — ONDANSETRON HCL 4 MG/2ML IJ SOLN: 4.0000 mg | Freq: Four times a day (QID) | INTRAMUSCULAR | Status: DC | PRN

## 2014-02-06 MED ORDER — PROPOFOL 10 MG/ML IV BOLUS
INTRAVENOUS | Status: DC | PRN
  Administered 2014-02-06: 120 mg via INTRAVENOUS

## 2014-02-06 MED ORDER — INSULIN ASPART 100 UNIT/ML ~~LOC~~ SOLN
SUBCUTANEOUS | Status: AC
  Filled 2014-02-06: qty 1

## 2014-02-06 MED ORDER — CEFAZOLIN SODIUM 1-5 GM-% IV SOLN
1.0000 g | Freq: Three times a day (TID) | INTRAVENOUS | Status: AC
  Administered 2014-02-06: 1 g via INTRAVENOUS
  Filled 2014-02-06: qty 50

## 2014-02-06 MED ORDER — HYDROMORPHONE HCL PF 1 MG/ML IJ SOLN
0.2500 mg | INTRAMUSCULAR | Status: DC | PRN
Start: 2014-02-06 — End: 2014-02-06

## 2014-02-06 MED ORDER — BUPIVACAINE-EPINEPHRINE 0.25% -1:200000 IJ SOLN
INTRAMUSCULAR | Status: DC | PRN
  Administered 2014-02-06: 30 mL
  Administered 2014-02-06: 20 mL

## 2014-02-06 MED ORDER — AMLODIPINE BESYLATE 10 MG PO TABS
10.0000 mg | ORAL_TABLET | Freq: Once | ORAL | Status: AC
  Administered 2014-02-06: 10 mg via ORAL
  Filled 2014-02-06 (×2): qty 1

## 2014-02-06 MED ORDER — HYDROCHLOROTHIAZIDE 25 MG PO TABS
25.0000 mg | ORAL_TABLET | Freq: Every morning | ORAL | Status: DC
  Administered 2014-02-07 – 2014-02-10 (×4): 25 mg via ORAL
  Filled 2014-02-06 (×4): qty 1

## 2014-02-06 MED ORDER — EPHEDRINE SULFATE 50 MG/ML IJ SOLN
INTRAMUSCULAR | Status: DC | PRN
Start: 2014-02-06 — End: 2014-02-06
  Administered 2014-02-06: 5 mg via INTRAVENOUS

## 2014-02-06 MED ORDER — MORPHINE SULFATE (PF) 1 MG/ML IV SOLN
INTRAVENOUS | Status: AC
  Filled 2014-02-06: qty 25

## 2014-02-06 MED ORDER — FENTANYL CITRATE 0.05 MG/ML IJ SOLN
INTRAMUSCULAR | Status: AC
  Filled 2014-02-06: qty 5

## 2014-02-06 MED ORDER — ONDANSETRON HCL 4 MG/2ML IJ SOLN
INTRAMUSCULAR | Status: AC
  Filled 2014-02-06: qty 2

## 2014-02-06 MED ORDER — HEPARIN SODIUM (PORCINE) 5000 UNIT/ML IJ SOLN
5000.0000 [IU] | Freq: Three times a day (TID) | INTRAMUSCULAR | Status: DC
  Administered 2014-02-07 – 2014-02-10 (×11): 5000 [IU] via SUBCUTANEOUS
  Filled 2014-02-06 (×13): qty 1

## 2014-02-06 MED ORDER — DEXAMETHASONE SODIUM PHOSPHATE 10 MG/ML IJ SOLN
INTRAMUSCULAR | Status: DC | PRN
  Administered 2014-02-06: 10 mg via INTRAVENOUS

## 2014-02-06 MED ORDER — HYDROMORPHONE HCL PF 1 MG/ML IJ SOLN
INTRAMUSCULAR | Status: DC | PRN
  Administered 2014-02-06: 1.5 mg via INTRAVENOUS
  Administered 2014-02-06: 0.5 mg via INTRAVENOUS

## 2014-02-06 MED ORDER — CEFAZOLIN SODIUM-DEXTROSE 2-3 GM-% IV SOLR
2.0000 g | INTRAVENOUS | Status: AC
  Administered 2014-02-06: 2 g via INTRAVENOUS

## 2014-02-06 MED ORDER — HYDROMORPHONE HCL PF 2 MG/ML IJ SOLN
INTRAMUSCULAR | Status: AC
  Filled 2014-02-06: qty 1

## 2014-02-06 MED ORDER — INSULIN GLARGINE 100 UNIT/ML ~~LOC~~ SOLN
5.0000 [IU] | Freq: Every day | SUBCUTANEOUS | Status: DC
  Administered 2014-02-06 – 2014-02-09 (×4): 5 [IU] via SUBCUTANEOUS
  Filled 2014-02-06 (×6): qty 0.05

## 2014-02-06 MED ORDER — METFORMIN HCL 500 MG PO TABS
1000.0000 mg | ORAL_TABLET | Freq: Two times a day (BID) | ORAL | Status: DC
  Administered 2014-02-07 – 2014-02-10 (×7): 1000 mg via ORAL
  Filled 2014-02-06 (×9): qty 2

## 2014-02-06 MED ORDER — CEFAZOLIN SODIUM-DEXTROSE 2-3 GM-% IV SOLR
INTRAVENOUS | Status: AC
  Filled 2014-02-06: qty 50

## 2014-02-06 MED ORDER — SODIUM CHLORIDE 0.9 % IJ SOLN: 9.0000 mL | INTRAMUSCULAR | Status: DC | PRN

## 2014-02-06 MED ORDER — NALOXONE HCL 0.4 MG/ML IJ SOLN: 0.4000 mg | INTRAMUSCULAR | Status: DC | PRN

## 2014-02-06 MED ORDER — PROPOFOL 10 MG/ML IV BOLUS
INTRAVENOUS | Status: AC
  Filled 2014-02-06: qty 20

## 2014-02-06 MED ORDER — LACTATED RINGERS IV SOLN
INTRAVENOUS | Status: DC
Start: 2014-02-06 — End: 2014-02-06
  Administered 2014-02-06: 1000 mL via INTRAVENOUS

## 2014-02-06 MED ORDER — LIDOCAINE HCL (CARDIAC) 20 MG/ML IV SOLN
INTRAVENOUS | Status: DC | PRN
  Administered 2014-02-06: 80 mg via INTRAVENOUS

## 2014-02-06 MED ORDER — PREDNISONE 10 MG PO TABS
10.0000 mg | ORAL_TABLET | Freq: Every day | ORAL | Status: DC
  Administered 2014-02-06 – 2014-02-10 (×5): 10 mg via ORAL
  Filled 2014-02-06 (×5): qty 1

## 2014-02-06 MED ORDER — FENTANYL CITRATE 0.05 MG/ML IJ SOLN
INTRAMUSCULAR | Status: DC | PRN
  Administered 2014-02-06 (×7): 50 ug via INTRAVENOUS

## 2014-02-06 MED ORDER — INSULIN ASPART 100 UNIT/ML ~~LOC~~ SOLN
15.0000 [IU] | Freq: Once | SUBCUTANEOUS | Status: AC
  Administered 2014-02-06: 15 [IU] via SUBCUTANEOUS

## 2014-02-06 MED ORDER — ONDANSETRON HCL 4 MG/2ML IJ SOLN
INTRAMUSCULAR | Status: AC | PRN
  Administered 2014-02-06: 4 mg via INTRAVENOUS

## 2014-02-06 MED ORDER — NEOSTIGMINE METHYLSULFATE 10 MG/10ML IV SOLN
INTRAVENOUS | Status: DC | PRN
  Administered 2014-02-06: 4 mg via INTRAVENOUS

## 2014-02-06 MED ORDER — FENTANYL CITRATE 0.05 MG/ML IJ SOLN
INTRAMUSCULAR | Status: AC
  Filled 2014-02-06: qty 2

## 2014-02-06 MED ORDER — BUPIVACAINE-EPINEPHRINE 0.25% -1:200000 IJ SOLN
INTRAMUSCULAR | Status: AC
  Filled 2014-02-06: qty 1

## 2014-02-06 MED ORDER — DIPHENHYDRAMINE HCL 12.5 MG/5ML PO ELIX: 12.5000 mg | ORAL_SOLUTION | Freq: Four times a day (QID) | ORAL | Status: DC | PRN

## 2014-02-06 MED ORDER — AMLODIPINE BESYLATE 10 MG PO TABS
10.0000 mg | ORAL_TABLET | Freq: Every day | ORAL | Status: DC
  Administered 2014-02-07 – 2014-02-10 (×4): 10 mg via ORAL
  Filled 2014-02-06 (×4): qty 1

## 2014-02-06 MED ORDER — LINAGLIPTIN 5 MG PO TABS
5.0000 mg | ORAL_TABLET | Freq: Every day | ORAL | Status: DC
  Administered 2014-02-07 – 2014-02-10 (×4): 5 mg via ORAL
  Filled 2014-02-06 (×5): qty 1

## 2014-02-06 MED ORDER — IRBESARTAN 300 MG PO TABS
300.0000 mg | ORAL_TABLET | Freq: Every day | ORAL | Status: DC
  Administered 2014-02-07 – 2014-02-10 (×4): 300 mg via ORAL
  Filled 2014-02-06 (×4): qty 1

## 2014-02-06 MED ORDER — FENTANYL CITRATE 0.05 MG/ML IJ SOLN
25.0000 ug | INTRAMUSCULAR | Status: DC | PRN
Start: 2014-02-06 — End: 2014-02-06
  Administered 2014-02-06: 25 ug via INTRAVENOUS

## 2014-02-06 MED ORDER — KETOROLAC TROMETHAMINE 30 MG/ML IJ SOLN
15.0000 mg | Freq: Once | INTRAMUSCULAR | Status: AC | PRN
  Administered 2014-02-06: 30 mg via INTRAVENOUS

## 2014-02-06 MED ORDER — GLYCOPYRROLATE 0.2 MG/ML IJ SOLN
INTRAMUSCULAR | Status: DC | PRN
  Administered 2014-02-06: .6 mg via INTRAVENOUS

## 2014-02-06 MED ORDER — LIDOCAINE HCL (CARDIAC) 20 MG/ML IV SOLN
INTRAVENOUS | Status: AC
  Filled 2014-02-06: qty 5

## 2014-02-06 MED ORDER — PROMETHAZINE HCL 25 MG/ML IJ SOLN: 6.2500 mg | INTRAMUSCULAR | Status: DC | PRN

## 2014-02-06 SURGICAL SUPPLY — 56 items
BANDAGE ADH SHEER 1  50/CT (GAUZE/BANDAGES/DRESSINGS) IMPLANT
BENZOIN TINCTURE PRP APPL 2/3 (GAUZE/BANDAGES/DRESSINGS) ×4 IMPLANT
BINDER ABDOMINAL 12 ML 46-62 (SOFTGOODS) IMPLANT
CANISTER SUCTION 2500CC (MISCELLANEOUS) ×4 IMPLANT
CHLORAPREP W/TINT 26ML (MISCELLANEOUS) ×4 IMPLANT
CLOSURE WOUND 1/2 X4 (GAUZE/BANDAGES/DRESSINGS) ×2
DECANTER SPIKE VIAL GLASS SM (MISCELLANEOUS) ×4 IMPLANT
DEVICE SECURE STRAP 25 ABSORB (INSTRUMENTS) ×4 IMPLANT
DEVICE TROCAR PUNCTURE CLOSURE (ENDOMECHANICALS) ×4 IMPLANT
DISSECTOR BLUNT TIP ENDO 5MM (MISCELLANEOUS) ×4 IMPLANT
DRAIN CHANNEL RND F F (WOUND CARE) ×4 IMPLANT
DRAPE LAPAROSCOPIC ABDOMINAL (DRAPES) ×4 IMPLANT
DRAPE UTILITY XL STRL (DRAPES) ×16 IMPLANT
DRSG TEGADERM 2-3/8X2-3/4 SM (GAUZE/BANDAGES/DRESSINGS) IMPLANT
ELECT PENCIL ROCKER SW 15FT (MISCELLANEOUS) ×4 IMPLANT
ELECT REM PT RETURN 9FT ADLT (ELECTROSURGICAL) ×8
ELECTRODE REM PT RTRN 9FT ADLT (ELECTROSURGICAL) ×4 IMPLANT
EVACUATOR 1/8 PVC DRAIN (DRAIN) ×4 IMPLANT
EVACUATOR DRAINAGE 10X20 100CC (DRAIN) ×2 IMPLANT
EVACUATOR SILICONE 100CC (DRAIN) ×2
GLOVE BIOGEL M STRL SZ7.5 (GLOVE) ×4 IMPLANT
GLOVE INDICATOR 8.0 STRL GRN (GLOVE) ×4 IMPLANT
GLOVE SURG SIGNA 7.5 PF LTX (GLOVE) ×4 IMPLANT
GOWN STRL REUS W/TWL LRG LVL3 (GOWN DISPOSABLE) ×8 IMPLANT
GOWN STRL REUS W/TWL XL LVL3 (GOWN DISPOSABLE) ×12 IMPLANT
KIT BASIN OR (CUSTOM PROCEDURE TRAY) ×4 IMPLANT
MARKER SKIN DUAL TIP RULER LAB (MISCELLANEOUS) ×4 IMPLANT
MESH VENTRALIGHT ST 8X10 (Mesh General) ×4 IMPLANT
NEEDLE INSUFFLATION 14GA 120MM (NEEDLE) IMPLANT
NEEDLE SPNL 22GX3.5 QUINCKE BK (NEEDLE) ×4 IMPLANT
NS IRRIG 1000ML POUR BTL (IV SOLUTION) ×4 IMPLANT
PAIN PUMP ON-Q 400MLX5ML 5IN (MISCELLANEOUS) ×8 IMPLANT
PENCIL BUTTON HOLSTER BLD 10FT (ELECTRODE) IMPLANT
SCISSORS LAP 5X35 DISP (ENDOMECHANICALS) ×4 IMPLANT
SET IRRIG TUBING LAPAROSCOPIC (IRRIGATION / IRRIGATOR) ×4 IMPLANT
SHEARS HARMONIC ACE PLUS 36CM (ENDOMECHANICALS) IMPLANT
SOLUTION ANTI FOG 6CC (MISCELLANEOUS) ×4 IMPLANT
SPONGE DRAIN TRACH 4X4 STRL 2S (GAUZE/BANDAGES/DRESSINGS) ×4 IMPLANT
SPONGE GAUZE 4X4 12PLY (GAUZE/BANDAGES/DRESSINGS) ×4 IMPLANT
SPONGE LAP 18X18 X RAY DECT (DISPOSABLE) ×8 IMPLANT
STRIP CLOSURE SKIN 1/2X4 (GAUZE/BANDAGES/DRESSINGS) ×6 IMPLANT
SUT MNCRL AB 4-0 PS2 18 (SUTURE) IMPLANT
SUT NOVA 0 T19/GS 22DT (SUTURE) IMPLANT
SUT NOVA 1 T20/GS 25DT (SUTURE) ×4 IMPLANT
SUT PDS AB 1 TP1 96 (SUTURE) ×8 IMPLANT
SUT SILK 2 0 SH CR/8 (SUTURE) ×4 IMPLANT
SUT SILK 3 0 SH 30 (SUTURE) ×4 IMPLANT
TACKER 5MM HERNIA 3.5CML NAB (ENDOMECHANICALS) IMPLANT
TOWEL OR 17X26 10 PK STRL BLUE (TOWEL DISPOSABLE) ×4 IMPLANT
TRAY FOLEY CATH 14FRSI W/METER (CATHETERS) ×4 IMPLANT
TRAY LAP CHOLE (CUSTOM PROCEDURE TRAY) ×4 IMPLANT
TROCAR BLADELESS OPT 5 75 (ENDOMECHANICALS) ×8 IMPLANT
TROCAR XCEL BLUNT TIP 100MML (ENDOMECHANICALS) ×4 IMPLANT
TROCAR XCEL NON-BLD 11X100MML (ENDOMECHANICALS) ×4 IMPLANT
TROCAR XCEL UNIV SLVE 11M 100M (ENDOMECHANICALS) ×4 IMPLANT
TUBING INSUFFLATION 10FT LAP (TUBING) ×4 IMPLANT

## 2014-02-06 NOTE — Anesthesia Preprocedure Evaluation (Addendum)
Anesthesia Evaluation  Patient identified by MRN, date of birth, ID band Patient awake    Reviewed: Allergy & Precautions, H&P , NPO status , Patient's Chart, lab work & pertinent test results  Airway Mallampati: II TM Distance: >3 FB Neck ROM: Full    Dental no notable dental hx.    Pulmonary neg pulmonary ROS,  breath sounds clear to auscultation  Pulmonary exam normal       Cardiovascular hypertension, Pt. on medications Rhythm:Regular Rate:Normal  Patient denies chest pain and SOB within past 3 months  EKG unchanged from 2013   Neuro/Psych negative neurological ROS  negative psych ROS   GI/Hepatic negative GI ROS, Neg liver ROS,   Endo/Other  diabetes, Insulin Dependent  Renal/GU negative Renal ROS  negative genitourinary   Musculoskeletal  (+) Arthritis -, Rheumatoid disorders and on steriods ,    Abdominal   Peds negative pediatric ROS (+)  Hematology negative hematology ROS (+)   Anesthesia Other Findings   Reproductive/Obstetrics negative OB ROS                         Anesthesia Physical Anesthesia Plan  ASA: III  Anesthesia Plan: General   Post-op Pain Management:    Induction: Intravenous  Airway Management Planned: Oral ETT  Additional Equipment:   Intra-op Plan:   Post-operative Plan: Extubation in OR  Informed Consent: I have reviewed the patients History and Physical, chart, labs and discussed the procedure including the risks, benefits and alternatives for the proposed anesthesia with the patient or authorized representative who has indicated his/her understanding and acceptance.   Dental advisory given  Plan Discussed with: CRNA and Surgeon  Anesthesia Plan Comments:         Anesthesia Quick Evaluation

## 2014-02-06 NOTE — H&P (View-Only) (Signed)
Patient ID: Stephanie Mays, female   DOB: 08-11-1950, 63 y.o.   MRN: [id number]  Chief Complaint  Patient presents with  . Incisional Hernia    HPI Stephanie Mays is a 63 y.o. female.   HPI 63 year old African American female comes in because of pain now symptomatic incisional hernia. I initially met her on 04/11/2013. At that time we diagnosed an incisional hernia but she was asymptomatic at that time. She stated she had the hernia-bulge since around 2007. She has had a prior abdominal total hysterectomy. We discussed surgery at that time however she wanted to think about it. Apparently she is now symptomatic. She went to the emergency room in March with a partially incarcerated hernia but was able to be reduced and she was sent home from the emergency room. She underwent labs and a CT scan at that time. She states that she had a hard bulge, severe pain over the hernia site, as well as a lot of nausea. Since that time she has not had these symptoms but is afraid of recurrent symptoms. She denies any fever, chills. She has been evaluated recently by neurology because of a left foot drop which is felt to be probably a sequela of her diabetes. She denies any unplanned weight loss. She denies any melena or hematochezia. Past Medical History  Diagnosis Date  . Anemia   . Diabetes mellitus   . Hypertension   . Hyperlipidemia   . Arthritis 1999    rheumatoid  . RA (rheumatoid arthritis)   . Neuromuscular disorder   . Foot drop, left 10/23/2013    Past Surgical History  Procedure Laterality Date  . Abdominal hysterectomy    . Colonoscopy  06/27/2011    Procedure: COLONOSCOPY;  Surgeon: Rogene Houston, MD;  Location: [facility];  Service: Endoscopy;  Laterality: N/A;  7:30    Family History  Problem Relation Age of Onset  . Cancer Father     throat  . Diabetes Father   . Hypertension Father   . Diabetes Sister   . Hypertension Sister   . Diabetes Sister   . Cancer Brother     colon  .  Cancer Mother     leukemia    Social History History  Substance Use Topics  . Smoking status: Never Smoker   . Smokeless tobacco: Never Used  . Alcohol Use: No    No Known Allergies  Current Outpatient Prescriptions  Medication Sig Dispense Refill  . ACCU-CHEK AVIVA PLUS test strip TEST ONCE DAILY AS DIRECTED.  50 each  3  . ACCU-CHEK FASTCLIX LANCETS MISC TESTING ONCE DAILY.  102 each  2  . alendronate (FOSAMAX) 70 MG tablet TAKE 1 TAB EACH WEEK 30 MIN PRIOR TO BREAKFAST WITH LARGE GLASS OF WATER. REMAIN UPRIGHT.  4 tablet  5  . amLODipine-valsartan (EXFORGE) 10-320 MG per tablet TAKE 1 TABLET EVERY DAY.  30 tablet  5  . aspirin 81 MG tablet Take 81 mg by mouth daily.        . Calcium Citrate-Vitamin D (CITRACAL + D PO) Take 1 tablet by mouth daily.        . Flaxseed, Linseed, (FLAX SEED OIL PO) Take 1 tablet by mouth daily.        . folic acid (FOLVITE) 1 MG tablet TAKE 1 TABLET EVERY DAY.  30 tablet  3  . glipiZIDE (GLUCOTROL) 10 MG tablet TAKE (1) TABLET BY MOUTH TWICE DAILY BEFORE A MEAL.  [place]  tablet  5  . hydrochlorothiazide (HYDRODIURIL) 25 MG tablet TAKE ONE TABLET BY MOUTH ONCE DAILY.  30 tablet  3  . HYDROcodone-acetaminophen (NORCO/VICODIN) 5-325 MG per tablet Take 2 tablets by mouth every 4 (four) hours as needed.  20 tablet  0  . Insulin Glargine (LANTUS SOLOSTAR) 100 UNIT/ML SOPN 15 units once daily. Dose increase effective 07/01/2013  5 pen  3  . methotrexate (RHEUMATREX) 2.5 MG tablet Take 20 mg by mouth once a week. 8 tabs once weekly. On Friday      . metoprolol succinate (TOPROL-XL) 100 MG 24 hr tablet TAKE ONE TABLET BY MOUTH ONCE DAILY.  30 tablet  5  . Multiple Vitamin (MULTIVITAMIN) tablet Take 1 tablet by mouth daily.        . pravastatin (PRAVACHOL) 20 MG tablet TAKE ONE TABLET BY MOUTH DAILY.  30 tablet  3  . predniSONE (DELTASONE) 5 MG tablet Take 5 mg by mouth daily.      . Saxagliptin-Metformin (KOMBIGLYZE XR) 2.11-998 MG TB24 TAKE 1 TABLET BY MOUTH TWICE  A DAY WITH FOOD FOR DIABETES.  60 tablet  5  . vitamin B-12 (CYANOCOBALAMIN) 1000 MCG tablet Take 1,000 mcg by mouth daily.       No current facility-administered medications for this visit.    Review of Systems Review of Systems  Constitutional: Negative for fever, activity change, appetite change and unexpected weight change.  HENT: Negative for nosebleeds and trouble swallowing.   Eyes: Negative for photophobia and visual disturbance.  Respiratory: Negative for chest tightness and shortness of breath.   Cardiovascular: Negative for chest pain and leg swelling.       Denies CP, SOB, orthopnea, PND, DOE  Gastrointestinal: Positive for nausea and abdominal pain. Negative for vomiting, diarrhea, constipation and blood in stool.  Genitourinary: Negative for dysuria and difficulty urinating.  Musculoskeletal: Negative for arthralgias.  Skin: Negative for pallor and rash.  Neurological: Negative for dizziness, seizures, facial asymmetry and numbness.       Denies TIA and amaurosis fugax   Hematological: Negative for adenopathy. Does not bruise/bleed easily.  Psychiatric/Behavioral: Negative for behavioral problems and agitation.    Blood pressure 130/80, pulse 92, temperature 98.6 F (37 C), resp. rate 16, height 5' 3"  (1.6 m), weight 140 lb 12.8 oz (63.866 kg).  Physical Exam Physical Exam  Vitals reviewed. Constitutional: She is oriented to person, place, and time. She appears well-developed and well-nourished. No distress.  HENT:  Head: Normocephalic and atraumatic.  Right Ear: External ear normal.  Left Ear: External ear normal.  Eyes: Conjunctivae are normal. No scleral icterus.  Neck: Normal range of motion. Neck supple. No tracheal deviation present. No thyromegaly present.  Cardiovascular: Normal rate and normal heart sounds.   Pulmonary/Chest: Effort normal and breath sounds normal. No stridor. No respiratory distress. She has no wheezes.  Abdominal: Soft. She exhibits no  distension. There is no tenderness. There is no rebound and no guarding.    Unable to completely reduce hernia. Can't tell exact fascial dimensions; soft, nt over hernia. No skin changes  Musculoskeletal: She exhibits no edema and no tenderness.  Lymphadenopathy:    She has no cervical adenopathy.  Neurological: She is alert and oriented to person, place, and time. She exhibits normal muscle tone.  Skin: Skin is warm and dry. No rash noted. She is not diaphoretic. No erythema.  Psychiatric: She has a normal mood and affect. Her behavior is normal. Judgment and thought content normal.  Data Reviewed My office note ED note 09/2013 CT scan March 20/15 she has what appears to be 3 abdominal wall defects starting at the level of her umbilicus extending inferiorly. She has a small umbilical hernia with a width of around 3.5 cm. Inferiorly to this she has another hernia containing transverse colon with the mouth of the hernia measuring 4 cm. Just inferior to this there is a third hernia with a measurement of around 4 cm defect. There appears to be 3 cm of intact fascia inferiorly below this down to the pubic  Assessment    Incarcerated incisional hernia IDDM RA on chronic prednisone     Plan    We rediscussed the etiology of ventral hernias. We discussed the signs and symptoms of  strangulation. The patient was given educational material. I also drew diagrams.  We discussed nonoperative and operative management. With respect to operative management, we discussed both open repair and laparoscopic repair. We discussed the pros and cons of each approach. I discussed the typical aftercare with each procedure and how each procedure differs.  Because she is presented to the emergency room with a case of incarceration and the fact that I cannot get her hernia to completely reduce, I have recommended with proceeding with surgery in the near future. I believe her risk of strangulation is higher than  it used to be. The patient is also in agreement with proceeding with surgery  The patient has elected to Laparoscopic repair of ventral incarcerated incisional hernia with mesh  We discussed the risk and benefits of surgery including but not limited to bleeding, infection, injury to surrounding structures, hernia recurrence, mesh complications, hematoma/seroma formation, need to convert to an open procedure, blood clot formation, urinary retention, post operative ileus, general anesthesia risk, long-term abdominal pain. We discussed that this procedure can be quite uncomfortable and difficult to recover from based on how the mesh is secured to the abdominal wall. We discussed the importance of avoiding heavy lifting and straining for a period of 6 weeks.  She'll be scheduled for surgery in the near future.  [name]. Redmond Pulling, MD, FACS General, Bariatric, & Minimally Invasive Surgery [facility] Surgery, [place]          [facility] M 01/15/2014, 5:30 PM

## 2014-02-06 NOTE — Op Note (Signed)
02/06/2014  3:02 PM  PATIENT:  Stephanie Mays  63 y.o. female  PRE-OPERATIVE DIAGNOSIS:  incarcerated incisional hernia  POST-OPERATIVE DIAGNOSIS:  incarcerated incisional hernia  PROCEDURE:  Procedure(s): LAPAROSCOPIC ASSISTED INCARCERATED INCISIONAL HERNIA REPAIR  INSERTION OF MESH  SURGEON:  Surgeon(s): Gayland Curry, MD  ASSISTANTS: none   ANESTHESIA:   general  DRAINS: (18 fr) Jackson-Pratt drain(s) with closed bulb suction in the subcu   LOCAL MEDICATIONS USED:  MARCAINE     SPECIMEN:  Source of Specimen:  hernia sac  DISPOSITION OF SPECIMEN:  discarded  COUNTS:  YES  INDICATIONS FOR PROCEDURE: 63 year old African American female comes in because of pain now symptomatic incisional hernia. I initially met her on 04/11/2013. At that time we diagnosed an incisional hernia but she was asymptomatic at that time. She stated she had the hernia-bulge since around 2007. She has had a prior abdominal total hysterectomy. We discussed surgery at that time however she wanted to think about it. Apparently she is now symptomatic. She went to the emergency room in March with a partially incarcerated hernia but was able to be reduced and she was sent home from the emergency room. She underwent labs and a CT scan at that time. She states that she had a hard bulge, severe pain over the hernia site, as well as a lot of nausea. Since that time she has not had these symptoms but is afraid of recurrent symptoms We had discussed risks and benefits of ventral incisional hernia repair and typical postoperative course. Please see office h&p for those details  PROCEDURE: The patient was seen in the Holding Room. The risks, benefits, complications, treatment options, and expected outcomes were discussed with the patient. The possibilities of reaction to medication, pulmonary aspiration, perforation of viscus, bleeding, recurrent infection, the need for additional procedures, failure to diagnose a condition,  and creating a complication requiring transfusion or operation were discussed with the patient. The patient concurred with the proposed plan, giving informed consent.  The site of surgery properly noted/marked. The patient was taken to the operating room, identified as Stephanie Mays  and the procedure verified as laparoscopic assisted ventral incarcerated incisional hernia repair with mesh. A Time Out was held and the above information confirmed.    The patient was placed supine.  After establishing general anesthesia, a Foley catheter was placed.  The abdomen was prepped with Chloraprep and draped in standard fashion.  A 5 mm Optiview was used the cannulate the peritoneal cavity in the left upper quadrant below the costal margin.  Pneumoperitoneum was obtained by insufflating CO2, maintaining a maximum pressure of 15 mmHg.  The 5 mm 30-degree laparoscopic was inserted.  There were significant omental adhesions to the anterior abdominal wall in and around the hernia defect.  An 12-mm port was placed in the left anterior axillary line at the level of the umbilicus.  Another 5-mm port was placed in the left lower quadrant.  Harmonic scalpel and gentle traction were used to dissect the omental adhesions away from the upper anterior abdominal wall near the umbilicus.there was a small fascial defect at the level of the umbilicus. Inferior to this there is a larger defect which contained a large amount of unknown and a portion of the transverse colon. It took about 8 minutes to fully reduce it. There was some bleeding from some omentum which was taken care of with harmonic scalpel. This defect was about 4 cm long x 3 cm wide. Inferior to this  was a much larger separation of her midline fascia that extended down toward her pubis. There was about 3cm of intact lower midline fascia.  We cleared the entire abdominal wall and were able to visualize 3 fascial defects. We used a spinal needle to identify the extent of the  hernia defects.  This covered an area of about 18 cms long x 12 cm at its widest point. Given how wide the fascia was separated at the lowest fascial defect about the best approach would be to convert to an open procedure at this point, bring her fascia back together, and then do a mesh underlay given her diabetes and chronic steroid use. I felt that if we just try to bridge the fascial defect with a piece of mesh alone given her comorbidities she would be at higher risk for recurrence. Pneumoperitoneum was released. Her old midline incision was incised with a #10 blade. The subcutaneous tissue was divided with the cautery. We encountered the hernia sac and opened up the rest of the midline. I inspected the colon and the omentum and achieve hemostasis. There is a small defect in the gastrocolic ligament that I reapproximated with several interrupted 3-0 silk sutures in order to prevent an internal hernia. Kocher's were placed on each edge of the lower midline fascia. The majority of the hernia sac was excised with electrocautery both on the left and right side of the subcutaneous tissue. At this point I reapproximated the midline with #1 loop PDS from above and from below. I did place several interrupted internal #1 Novafil sutures as internal retention sutures. Pneumoperitoneum was reestablished. I selected a 20x25cm piece of Bard VentralightST mesh.  We placed 8 stay sutures of 1 Novofil around the edges of the mesh.  The mesh was then rolled up and inserted through the 12 mm port site.  The mesh was then unrolled.  The stay sutures were then pulled up through small stab incisions using the Endo-close device.  This deployed the mesh widely over the fascial defects.  The stay sutures were then tied down.  The Secure Strap device was then used to tack down the edges of the mesh at 1 cm intervals circumferentially.   Pneumoperitoneum was then released as we removed the remainder of the trocars.  A 19 [place] Blake  drain was placed in the subcutaneous tissue on top of the fascia and brought out through the right lower abdominal wall and secured to the skin with a 3-0 nylon suture. The midline incision was reapproximated with skin staples.The port sites were closed with skin staples.  All of the stay suture sites were thenclosed with benzoin and Steri-Strips. Sterile bandage was applied.  An abdominal binder was placed around the patient's abdomen.  The patient was extubated and brought to the recovery room in stable condition.  All sponge, instrument, and needle counts were correct prior to closure and at the conclusion of the case.   Type of repair - primary suture with mesh underlay  (choices - primary suture, mesh, or component)  Name of mesh - BardVentralight ST  Size of mesh -20 x 25cm  Mesh overlap - >5 cm  Placement of mesh - beneath fascia and into peritoneal cavity  (choices - beneath fascia and into peritoneal cavity, beneath fascia but external to peritoneal cavity, between the muscle and fascia, above or external to fascia)   PLAN OF CARE: Admit to inpatient   PATIENT DISPOSITION:  PACU - hemodynamically stable.   Delay start  of Pharmacological VTE agent (>24hrs) due to surgical blood loss or risk of bleeding:  no  [name]. Redmond Pulling, MD, FACS General, Bariatric, & Minimally Invasive Surgery [facility] Surgery, [place]

## 2014-02-06 NOTE — Interval H&P Note (Signed)
History and Physical Interval Note:  02/06/2014 12:09 PM  Stephanie Mays  has presented today for surgery, with the diagnosis of incarcerated incisional hernia  The various methods of treatment have been discussed with the patient and family. After consideration of risks, benefits and other options for treatment, the patient has consented to  Procedure(s): [place] (N/A) INSERTION OF MESH (N/A) as a surgical intervention .  The patient's history has been reviewed, patient examined, no change in status, stable for surgery.  I have reviewed the patient's chart and labs.  Questions were answered to the patient's satisfaction.    [name]. Redmond Pulling, MD, [place], Bariatric, & Minimally Invasive Surgery [facility] Surgery, [place]    [facility] M

## 2014-02-06 NOTE — Anesthesia Postprocedure Evaluation (Signed)
  Anesthesia Post-op Note  Patient: Stephanie Mays  Procedure(s) Performed: Procedure(s) (LRB): LAPAROSCOPIC INCARCERATED INCISIONAL HERNIA (N/A) INSERTION OF MESH (N/A)  Patient Location: PACU  Anesthesia Type: General  Level of Consciousness: awake and alert   Airway and Oxygen Therapy: Patient Spontanous Breathing  Post-op Pain: mild  Post-op Assessment: Post-op Vital signs reviewed, Patient's Cardiovascular Status Stable, Respiratory Function Stable, Patent Airway and No signs of Nausea or vomiting  Last Vitals:  Filed Vitals:   02/06/14 1017  BP: 139/87  Pulse: 82  Temp: 37.2 C  Resp: 16    Post-op Vital Signs: stable   Complications: No apparent anesthesia complications

## 2014-02-06 NOTE — Transfer of Care (Signed)
Immediate Anesthesia Transfer of Care Note  Patient: Stephanie Mays  Procedure(s) Performed: Procedure(s): LAPAROSCOPIC INCARCERATED INCISIONAL HERNIA (N/A) INSERTION OF MESH (N/A)  Patient Location: PACU  Anesthesia Type:General  Level of Consciousness: awake, pateint uncooperative, confused and responds to stimulation  Airway & Oxygen Therapy: Patient Spontanous Breathing and Patient connected to face mask oxygen  Post-op Assessment: Report given to PACU RN, Post -op Vital signs reviewed and stable and Patient moving all extremities  Post vital signs: Reviewed and stable  Complications: No apparent anesthesia complications

## 2014-02-07 LAB — BASIC METABOLIC PANEL
Anion gap: 12 (ref 5–15)
BUN: 13 mg/dL (ref 6–23)
CHLORIDE: 98 meq/L (ref 96–112)
CO2: 26 mEq/L (ref 19–32)
CREATININE: 0.72 mg/dL (ref 0.50–1.10)
Calcium: 8.9 mg/dL (ref 8.4–10.5)
GFR calc Af Amer: >90 mL/min (ref >90)
GFR calc non Af Amer: 89 mL/min — ABNORMAL LOW (ref >90)
Glucose, Bld: 148 mg/dL — ABNORMAL HIGH (ref 70–99)
Potassium: 4.2 mEq/L (ref 3.7–5.3)
Sodium: 136 mEq/L — ABNORMAL LOW (ref 137–147)

## 2014-02-07 LAB — GLUCOSE, CAPILLARY
Glucose-Capillary: 142 mg/dL — ABNORMAL HIGH (ref 70–99)
Glucose-Capillary: 160 mg/dL — ABNORMAL HIGH (ref 70–99)
Glucose-Capillary: 210 mg/dL — ABNORMAL HIGH (ref 70–99)
Glucose-Capillary: 186 mg/dL — ABNORMAL HIGH (ref 70–99)

## 2014-02-07 LAB — CBC
HEMATOCRIT: 33.3 % — AB (ref 36.0–46.0)
Hemoglobin: 10.9 g/dL — ABNORMAL LOW (ref 12.0–15.0)
MCH: 33.2 pg (ref 26.0–34.0)
MCHC: 32.7 g/dL (ref 30.0–36.0)
MCV: 101.5 fL — ABNORMAL HIGH (ref 78.0–100.0)
Platelets: 255 10*3/uL (ref 150–400)
RBC: 3.28 MIL/uL — ABNORMAL LOW (ref 3.87–5.11)
RDW: 15.2 % (ref 11.5–15.5)
WBC: 20.2 10*3/uL — ABNORMAL HIGH (ref 4.0–10.5)

## 2014-02-07 NOTE — Progress Notes (Addendum)
1 Day Post-Op LAPAROSCOPIC ASSISTED INCARCERATED INCISIONAL HERNIA REPAIR  INSERTION OF MESH  Subjective: Doing ok.  Having a lot of pain.  PCA controlling this.  No nausea.  Tolerating clears.  Does not wish to advance diet.  Objective: Vital signs in last 24 hours: Temp:  [98 F (36.7 C)-99.8 F (37.7 C)] 98.9 F (37.2 C) (07/18 0618) Pulse Rate:  [82-98] 82 (07/18 0618) Resp:  [12-22] 18 (07/18 0804) BP: (120-144)/(64-87) 125/66 mmHg (07/18 0618) SpO2:  [90 %-100 %] 96 % (07/18 0804) Weight:  [142 lb (64.411 kg)] 142 lb (64.411 kg) (07/17 1045)   Intake/Output from previous day: 07/17 0701 - 07/18 0700 In: 2860 [P.O.:660; I.V.:2200] Out: 1375 [Urine:1350; Drains:25] Intake/Output this shift:     General appearance: alert and cooperative GI: normal findings: soft, appropriately tender, wearing binder JP: small amt of bloody drainage Incision: no significant drainage  Lab Results:   Recent Labs  02/07/14 0545  WBC 20.2*  HGB 10.9*  HCT 33.3*  PLT 255   BMET  Recent Labs  02/07/14 0545  NA 136*  K 4.2  CL 98  CO2 26  GLUCOSE 148*  BUN 13  CREATININE 0.72  CALCIUM 8.9   PT/INR No results found for this basename: LABPROT, INR,  in the last 72 hours ABG No results found for this basename: PHART, PCO2, PO2, HCO3,  in the last 72 hours  MEDS, Scheduled . amLODipine  10 mg Oral Daily   And  . irbesartan  300 mg Oral Daily  . heparin subcutaneous  5,000 Units Subcutaneous 3 times per day  . hydrochlorothiazide  25 mg Oral q morning - 10a  . insulin aspart  0-15 Units Subcutaneous TID WC  . insulin glargine  5 Units Subcutaneous QHS  . linagliptin  5 mg Oral QAC breakfast   And  . metFORMIN  1,000 mg Oral BID WC  . metoprolol succinate  100 mg Oral QPM  . morphine   Intravenous 6 times per day  . predniSONE  10 mg Oral Daily    Studies/Results: No results found.  Assessment: s/p Procedure(s): LAPAROSCOPIC INCARCERATED INCISIONAL  HERNIA INSERTION OF MESH Patient Active Problem List   Diagnosis Date Noted  . Incisional hernia, incarcerated 02/06/2014  . Incarcerated incisional hernia 01/15/2014  . Foot drop, left 10/23/2013  . Foot pain, left 09/15/2013  . Hyperlipidemia LDL goal < 100 07/01/2013  . Cough 07/01/2013  . Diabetes mellitus, insulin dependent (IDDM), uncontrolled 08/13/2008  . DIABETIC PERIPHERAL NEUROPATHY 11/13/2007  . OSTEOPENIA 02/26/2007  . ANEMIA, IRON DEFICIENCY 09/27/2006  . ARTHRITIS, RHEUMATOID 09/27/2006  . HYPERTENSION 07/08/2006    Expected post op course  Plan: cont current management Ambulate   LOS: 1 day     .Rosario Adie, [facility], [place]   02/07/2014 9:25 AM

## 2014-02-08 LAB — GLUCOSE, CAPILLARY
GLUCOSE-CAPILLARY: 174 mg/dL — AB (ref 70–99)
GLUCOSE-CAPILLARY: 145 mg/dL — AB (ref 70–99)
Glucose-Capillary: 156 mg/dL — ABNORMAL HIGH (ref 70–99)
Glucose-Capillary: 215 mg/dL — ABNORMAL HIGH (ref 70–99)
Glucose-Capillary: 236 mg/dL — ABNORMAL HIGH (ref 70–99)

## 2014-02-08 MED ORDER — DOCUSATE SODIUM 100 MG PO CAPS
100.0000 mg | ORAL_CAPSULE | Freq: Two times a day (BID) | ORAL | Status: DC
  Administered 2014-02-08 – 2014-02-10 (×5): 100 mg via ORAL
  Filled 2014-02-08 (×6): qty 1

## 2014-02-08 NOTE — Progress Notes (Signed)
2 Days Post-Op LAPAROSCOPIC ASSISTED INCARCERATED INCISIONAL HERNIA REPAIR  INSERTION OF MESH  Subjective: Doing better.  Pain better, PCA controlling this.  No nausea.  Tolerating clears.  No flatus or BM  Objective: Vital signs in last 24 hours: Temp:  [98.6 F (37 C)-99.2 F (37.3 C)] 99.2 F (37.3 C) (07/19 0552) Pulse Rate:  [75-80] 75 (07/19 0552) Resp:  [12-22] 20 (07/19 0552) BP: (114-155)/(73-87) 133/87 mmHg (07/19 0552) SpO2:  [91 %-98 %] 98 % (07/19 0552)   Intake/Output from previous day: 07/18 0701 - 07/19 0700 In: 3121.3 [P.O.:720; I.V.:2401.3] Out: 2635 [Urine:2300; Drains:335] Intake/Output this shift:     General appearance: alert and cooperative GI: normal findings: soft, appropriately tender, wearing binder JP: small amt of bloody drainage Incision: no significant drainage  Lab Results:   Recent Labs  02/07/14 0545  WBC 20.2*  HGB 10.9*  HCT 33.3*  PLT 255   BMET  Recent Labs  02/07/14 0545  NA 136*  K 4.2  CL 98  CO2 26  GLUCOSE 148*  BUN 13  CREATININE 0.72  CALCIUM 8.9   PT/INR No results found for this basename: LABPROT, INR,  in the last 72 hours ABG No results found for this basename: PHART, PCO2, PO2, HCO3,  in the last 72 hours  MEDS, Scheduled . amLODipine  10 mg Oral Daily   And  . irbesartan  300 mg Oral Daily  . docusate sodium  100 mg Oral BID  . heparin subcutaneous  5,000 Units Subcutaneous 3 times per day  . hydrochlorothiazide  25 mg Oral q morning - 10a  . insulin aspart  0-15 Units Subcutaneous TID WC  . insulin glargine  5 Units Subcutaneous QHS  . linagliptin  5 mg Oral QAC breakfast   And  . metFORMIN  1,000 mg Oral BID WC  . metoprolol succinate  100 mg Oral QPM  . morphine   Intravenous 6 times per day  . predniSONE  10 mg Oral Daily    Studies/Results: No results found.  Assessment: s/p Procedure(s): LAPAROSCOPIC INCARCERATED INCISIONAL HERNIA INSERTION OF MESH Patient Active Problem List    Diagnosis Date Noted  . Incisional hernia, incarcerated 02/06/2014  . Incarcerated incisional hernia 01/15/2014  . Foot drop, left 10/23/2013  . Foot pain, left 09/15/2013  . Hyperlipidemia LDL goal < 100 07/01/2013  . Cough 07/01/2013  . Diabetes mellitus, insulin dependent (IDDM), uncontrolled 08/13/2008  . DIABETIC PERIPHERAL NEUROPATHY 11/13/2007  . OSTEOPENIA 02/26/2007  . ANEMIA, IRON DEFICIENCY 09/27/2006  . ARTHRITIS, RHEUMATOID 09/27/2006  . HYPERTENSION 07/08/2006    Expected post op course  Plan: KVO IV Advance to Full liquids Ambulate   LOS: 2 days     .Rosario Adie, [facility], [place]   02/08/2014 7:52 AM

## 2014-02-09 DIAGNOSIS — Z6825 Body mass index (BMI) 25.0-25.9, adult: Secondary | ICD-10-CM | POA: Diagnosis not present

## 2014-02-09 DIAGNOSIS — Z833 Family history of diabetes mellitus: Secondary | ICD-10-CM | POA: Diagnosis not present

## 2014-02-09 DIAGNOSIS — Z79899 Other long term (current) drug therapy: Secondary | ICD-10-CM | POA: Diagnosis not present

## 2014-02-09 DIAGNOSIS — Z8249 Family history of ischemic heart disease and other diseases of the circulatory system: Secondary | ICD-10-CM | POA: Diagnosis not present

## 2014-02-09 DIAGNOSIS — E785 Hyperlipidemia, unspecified: Secondary | ICD-10-CM | POA: Diagnosis present

## 2014-02-09 DIAGNOSIS — K430 Incisional hernia with obstruction, without gangrene: Secondary | ICD-10-CM | POA: Diagnosis present

## 2014-02-09 DIAGNOSIS — M216X9 Other acquired deformities of unspecified foot: Secondary | ICD-10-CM | POA: Diagnosis present

## 2014-02-09 DIAGNOSIS — IMO0002 Reserved for concepts with insufficient information to code with codable children: Secondary | ICD-10-CM | POA: Diagnosis not present

## 2014-02-09 DIAGNOSIS — E669 Obesity, unspecified: Secondary | ICD-10-CM | POA: Diagnosis present

## 2014-02-09 DIAGNOSIS — E1149 Type 2 diabetes mellitus with other diabetic neurological complication: Secondary | ICD-10-CM | POA: Diagnosis present

## 2014-02-09 DIAGNOSIS — I10 Essential (primary) hypertension: Secondary | ICD-10-CM | POA: Diagnosis present

## 2014-02-09 DIAGNOSIS — E1142 Type 2 diabetes mellitus with diabetic polyneuropathy: Secondary | ICD-10-CM | POA: Diagnosis present

## 2014-02-09 DIAGNOSIS — Z806 Family history of leukemia: Secondary | ICD-10-CM | POA: Diagnosis not present

## 2014-02-09 DIAGNOSIS — Z5331 Laparoscopic surgical procedure converted to open procedure: Secondary | ICD-10-CM | POA: Diagnosis not present

## 2014-02-09 DIAGNOSIS — R109 Unspecified abdominal pain: Secondary | ICD-10-CM | POA: Diagnosis present

## 2014-02-09 DIAGNOSIS — M069 Rheumatoid arthritis, unspecified: Secondary | ICD-10-CM | POA: Diagnosis present

## 2014-02-09 DIAGNOSIS — Z794 Long term (current) use of insulin: Secondary | ICD-10-CM | POA: Diagnosis not present

## 2014-02-09 LAB — GLUCOSE, CAPILLARY
GLUCOSE-CAPILLARY: 205 mg/dL — AB (ref 70–99)
GLUCOSE-CAPILLARY: 185 mg/dL — AB (ref 70–99)
Glucose-Capillary: 237 mg/dL — ABNORMAL HIGH (ref 70–99)
Glucose-Capillary: 209 mg/dL — ABNORMAL HIGH (ref 70–99)

## 2014-02-09 MED ORDER — OXYCODONE HCL 5 MG PO TABS
5.0000 mg | ORAL_TABLET | ORAL | Status: DC | PRN
  Administered 2014-02-09: 5 mg via ORAL
  Administered 2014-02-09: 10 mg via ORAL
  Administered 2014-02-10 (×2): 5 mg via ORAL
  Administered 2014-02-10: 10 mg via ORAL
  Filled 2014-02-09 (×2): qty 2
  Filled 2014-02-09 (×3): qty 1

## 2014-02-09 MED ORDER — BISACODYL 10 MG RE SUPP
10.0000 mg | Freq: Once | RECTAL | Status: AC
  Administered 2014-02-09: 10 mg via RECTAL
  Filled 2014-02-09: qty 1

## 2014-02-09 MED ORDER — ONDANSETRON HCL 4 MG/2ML IJ SOLN: 4.0000 mg | Freq: Four times a day (QID) | INTRAMUSCULAR | Status: DC | PRN

## 2014-02-09 MED ORDER — MORPHINE SULFATE 2 MG/ML IJ SOLN
1.0000 mg | INTRAMUSCULAR | Status: DC | PRN
  Administered 2014-02-09: 2 mg via INTRAVENOUS
  Filled 2014-02-09: qty 1

## 2014-02-09 NOTE — Evaluation (Signed)
Occupational Therapy Evaluation Patient Details Name: Stephanie Mays MRN: [id number] DOB: 11-21-1950 Today's Date: 02/09/2014    History of Present Illness LAPAROSCOPIC ASSISTED INCARCERATED INCISIONAL HERNIA REPAIR  02/08/14   Clinical Impression   Pt presents to OT with decreased I with ADL activity due to problems listed below. Pt will benefit from skilled OT to increase I with ADL activity and return to PLOF    Follow Up Recommendations  Home health OT;Supervision/Assistance - 24 hour    Equipment Recommendations  None recommended by OT       Precautions / Restrictions Precautions Precaution Comments: abd drain Required Braces or Orthoses: Other Brace/Splint Other Brace/Splint: abd. binder      Mobility Bed Mobility        pt in chair          Transfers Overall transfer level: Needs assistance Equipment used: Rolling walker (2 wheeled) Transfers: Sit to/from Stand           General transfer comment: extra time for pain in ABD.  Pt did sit on edge of chair with upright posture as pt did feel like she needed to belch.  Pt reported she was able to belch .        ADL Overall ADL's : Needs assistance/impaired     Grooming: Sitting;Set up   Upper Body Bathing: Set up;Sitting   Lower Body Bathing: Sit to/from stand;Moderate assistance   Upper Body Dressing : Min guard;Sitting   Lower Body Dressing: Sit to/from stand;Moderate assistance   Toilet Transfer: Minimal assistance;BSC   Toileting- Clothing Manipulation and Hygiene: Moderate assistance;Sit to/from stand                         Extremity/Trunk Assessment Upper Extremity Assessment Upper Extremity Assessment: Generalized weakness   Lower Extremity Assessment Lower Extremity Assessment: Generalized weakness   Cervical / Trunk Assessment Cervical / Trunk Assessment: Kyphotic   Communication Communication Communication: No difficulties   Cognition Arousal/Alertness:  Awake/alert Behavior During Therapy: WFL for tasks assessed/performed Overall Cognitive Status: Within Functional Limits for tasks assessed                                Home Living Family/patient expects to be discharged to:: Private residence Living Arrangements: Spouse/significant other Available Help at Discharge: Family Type of Home: House Home Access: Stairs to enter Technical brewer of Steps: 3   Home Layout: One level     Bathroom Shower/Tub: Teacher, early years/pre: Standard     Home Equipment: Bedside commode;Shower seat;Walker - 2 wheels          Prior Functioning/Environment Level of Independence: Independent             OT Diagnosis: Generalized weakness   OT Problem List: Decreased strength;Decreased activity tolerance   OT Treatment/Interventions: Self-care/ADL training;Patient/family education;DME and/or AE instruction    OT Goals(Current goals can be found in the care plan section) Acute Rehab OT Goals Patient Stated Goal: to get better OT Goal Formulation: With patient Time For Goal Achievement: 02/23/14 Potential to Achieve Goals: Good ADL Goals Pt Will Perform Grooming: with supervision;standing Pt Will Perform Lower Body Dressing: with supervision;with adaptive equipment;sit to/from stand Pt Will Transfer to Toilet: with supervision;ambulating;regular height toilet Pt Will Perform Toileting - Clothing Manipulation and hygiene: with supervision;sit to/from stand  OT Frequency: Min 2X/week   Barriers to D/C:  End of Session Nurse Communication: Mobility status  Activity Tolerance: Patient limited by fatigue Patient left: in chair   Time: 1253-1310 OT Time Calculation (min): 17 min Charges:  OT General Charges $OT Visit: 1 Procedure OT Evaluation $Initial OT Evaluation Tier I: 1 Procedure OT Treatments $Self Care/Home Management : 8-22 mins G-Codes: OT G-codes **NOT FOR INPATIENT  CLASS** Functional Assessment Tool Used: clinical observation Functional Limitation: Self care Self Care Current Status (Y8502): At least 40 percent but less than 60 percent impaired, limited or restricted Self Care Goal Status (D7412): At least 1 percent but less than 20 percent impaired, limited or restricted  Carey, Thereasa Parkin 02/09/2014, 3:01 PM

## 2014-02-09 NOTE — Evaluation (Signed)
Physical Therapy Evaluation Patient Details Name: Stephanie Mays MRN: [id number] DOB: [birth year] Today's Date: 02/09/2014   History of Present Illness  LAPAROSCOPIC ASSISTED INCARCERATED INCISIONAL HERNIA REPAIR  02/08/14  Clinical Impression  Pt ambulated x 100' with Rw. Pt will benefit from PT to address problems listed in chart below.    Follow Up Recommendations Home health PT (may not require)    Equipment Recommendations  Rolling walker with 5" wheels    Recommendations for Other Services       Precautions / Restrictions Precautions Precaution Comments: abd drain Required Braces or Orthoses: Other Brace/Splint Other Brace/Splint: abd. binder      Mobility  Bed Mobility                  Transfers Overall transfer level: Needs assistance Equipment used: Rolling walker (2 wheeled) Transfers: Sit to/from Stand           General transfer comment: extra time for pain in ABD.  Ambulation/Gait Ambulation/Gait assistance: Min guard Ambulation Distance (Feet): 100 Feet Assistive device: Rolling walker (2 wheeled) Gait Pattern/deviations: Step-through pattern Gait velocity: decreased   General Gait Details: extra time for walking, stops at times for pain  Stairs            Wheelchair Mobility    Modified Rankin (Stroke Patients Only)       Balance                                             Pertinent Vitals/Pain Pain is 5 abdomen    Home Living Family/patient expects to be discharged to:: Private residence Living Arrangements: Other relatives Available Help at Discharge:  (brother) Type of Home: House Home Access: Stairs to enter   Technical brewer of Steps: 3 Home Layout: One level Home Equipment: Bedside commode;Shower seat;Walker - 2 wheels      Prior Function Level of Independence: Independent               Hand Dominance        Extremity/Trunk Assessment               Lower  Extremity Assessment: Generalized weakness      Cervical / Trunk Assessment: Kyphotic  Communication   Communication: No difficulties  Cognition Arousal/Alertness: Awake/alert Behavior During Therapy: WFL for tasks assessed/performed Overall Cognitive Status: Within Functional Limits for tasks assessed                      General Comments      Exercises        Assessment/Plan    PT Assessment Patient needs continued PT services  PT Diagnosis Difficulty walking   PT Problem List Decreased strength;Decreased activity tolerance;Pain  PT Treatment Interventions DME instruction;Gait training;Functional mobility training;Therapeutic activities;Patient/family education   PT Goals (Current goals can be found in the Care Plan section) Acute Rehab PT Goals Patient Stated Goal: to get better PT Goal Formulation: With patient Time For Goal Achievement: 02/23/14 Potential to Achieve Goals: Good    Frequency Min 3X/week   Barriers to discharge        Co-evaluation               End of Session   Activity Tolerance: Patient tolerated treatment well Patient left: in chair Nurse Communication: Mobility status    Functional Assessment Tool Used:  clinical judgement Functional Limitation: Mobility: Walking and moving around Mobility: Walking and Moving Around Current Status [phone]): At least 20 percent but less than 40 percent impaired, limited or restricted Mobility: Walking and Moving Around Goal Status [phone]): 0 percent impaired, limited or restricted    Time: 1194-1740 PT Time Calculation (min): 20 min   Charges:   PT Evaluation $Initial PT Evaluation Tier I: 1 Procedure PT Treatments $Gait Training: 8-22 mins   PT G Codes:   Functional Assessment Tool Used: clinical judgement Functional Limitation: Mobility: Walking and moving around    Aledo, Shella Maxim 02/09/2014, 1:29 PM Tresa Endo PT [phone]

## 2014-02-09 NOTE — Progress Notes (Signed)
3 Days Post-Op  Subjective: No n/v. Tolerated fulls however no flatus/BM. Nurse reports pt having difficult time ambulating. Pt has had L foot drop problems  Objective: Vital signs in last 24 hours: Temp:  [98.8 F (37.1 C)-99.8 F (37.7 C)] 98.8 F (37.1 C) (07/20 0500) Pulse Rate:  [75-82] 82 (07/20 0500) Resp:  [15-18] 16 (07/20 0500) BP: (118-129)/(70-84) 129/79 mmHg (07/20 0500) SpO2:  [92 %-100 %] 100 % (07/20 0500)    Intake/Output from previous day: 07/19 0701 - 07/20 0700 In: 1317.1 [P.O.:960; I.V.:357.1] Out: 2052 [Urine:1900; Drains:152] Intake/Output this shift: Total I/O In: 90 [I.V.:90] Out: 647 [Urine:600; Drains:47]  Asleep, easily awakens cta b/l ant Reg  Obese, soft, approp TTP. Incision c/d/i. Drain - serosang; +binder No edema  Lab Results:   Recent Labs  02/07/14 0545  WBC 20.2*  HGB 10.9*  HCT 33.3*  PLT 255   BMET  Recent Labs  02/07/14 0545  NA 136*  K 4.2  CL 98  CO2 26  GLUCOSE 148*  BUN 13  CREATININE 0.72  CALCIUM 8.9   PT/INR No results found for this basename: LABPROT, INR,  in the last 72 hours ABG No results found for this basename: PHART, PCO2, PO2, HCO3,  in the last 72 hours  Studies/Results: No results found.  Anti-infectives: Anti-infectives   Start     Dose/Rate Route Frequency Ordered Stop   02/06/14 2200  ceFAZolin (ANCEF) IVPB 1 g/50 mL premix     1 g 100 mL/hr over 30 Minutes Intravenous 3 times per day 02/06/14 1818 02/06/14 2232   02/06/14 1006  ceFAZolin (ANCEF) IVPB 2 g/50 mL premix     2 g 100 mL/hr over 30 Minutes Intravenous On call to O.R. 02/06/14 1006 02/06/14 1241      Assessment/Plan: s/p Procedure(s): LAPAROSCOPIC INCARCERATED INCISIONAL HERNIA (N/A) INSERTION OF MESH (N/A) Active Problems:   Diabetes mellitus, insulin dependent (IDDM), uncontrolled   DIABETIC PERIPHERAL NEUROPATHY   HYPERTENSION   Foot drop, left   Incisional hernia, incarcerated  Stay on fulls until bowel  function returns Dulcolax suppository D/c pca; oral pain meds PT/OT Cont lantus and SSI Cont VTE prophylaxis  [name]. Redmond Pulling, MD, FACS General, Bariatric, & Minimally Invasive Surgery [facility], [place]   LOS: 3 days    Stephanie Mays 02/09/2014

## 2014-02-09 NOTE — Progress Notes (Signed)
Utilization review completed.  

## 2014-02-10 ENCOUNTER — Encounter (HOSPITAL_COMMUNITY): Payer: Self-pay | Admitting: General Surgery

## 2014-02-10 LAB — BASIC METABOLIC PANEL
Anion gap: 16 — ABNORMAL HIGH (ref 5–15)
BUN: 19 mg/dL (ref 6–23)
CALCIUM: 9.5 mg/dL (ref 8.4–10.5)
CO2: 21 mEq/L (ref 19–32)
CREATININE: 0.67 mg/dL (ref 0.50–1.10)
Chloride: 89 mEq/L — ABNORMAL LOW (ref 96–112)
Glucose, Bld: 186 mg/dL — ABNORMAL HIGH (ref 70–99)
Potassium: 4.1 mEq/L (ref 3.7–5.3)
Sodium: 126 mEq/L — ABNORMAL LOW (ref 137–147)

## 2014-02-10 LAB — MAGNESIUM: Magnesium: 1.9 mg/dL (ref 1.5–2.5)

## 2014-02-10 LAB — GLUCOSE, CAPILLARY
Glucose-Capillary: 167 mg/dL — ABNORMAL HIGH (ref 70–99)
Glucose-Capillary: 194 mg/dL — ABNORMAL HIGH (ref 70–99)

## 2014-02-10 NOTE — Discharge Summary (Signed)
Physician Discharge Summary  Stephanie Mays EUM:[id number] DOB: 1951/04/19 DOA: 02/06/2014  PCP: Tula Nakayama, MD  Admit date: 02/06/2014 Discharge date: 02/10/2014  Recommendations for Outpatient Follow-up:  1. HomeHealth PT/OT  Follow-up Information   Follow up with Gayland Curry, MD On 03/02/2014. (5pm (arrive at 4:45 pm) , For wound re-check)    Specialty:  General Surgery   Contact information:   [place] [address] [phone]       Follow up with [facility] On 02/17/2014. (7/28, For suture removal- OUR OFFICE WILL CALL YOU WITH APPT TIME)      Discharge Diagnoses:  Active Problems:   Diabetes mellitus, insulin dependent (IDDM), uncontrolled   DIABETIC PERIPHERAL NEUROPATHY   HYPERTENSION   Foot drop, left   Incisional hernia, incarcerated  Surgical Procedure: Laparoscopic assisted repair of incarcerated ventral incisional hernia with mesh  Discharge Condition: good Disposition: home with HHPT/HHOT  Diet recommendation: diabetic  Filed Weights   02/06/14 1045  Weight: 142 lb (64.411 kg)    Hospital Course:  Patient was admitted for a planned repair of an incarcerated ventral incisional hernia. She actually had 3 lower midline fascial defects. The cause of the size of the most inferior defect I felt a pure laparoscopic approach would not be in her best interest for long-term prevention of recurrence; therefore, she underwent a hybrid repair. Essentially an open repair with a mesh underlay. A drain was placed in her subcutaneous tissue to help prevent a seroma from forming. She was maintained on perioperative subcutaneous heparin. She was initially managed with a morphine PCA for pain control. Her diet was gradually advanced over the weekend. Her PCA was discontinued on Monday and she was switched to oral pain medicine. She started having flatus and bowel movements on postoperative day 3. On postoperative day 4 she was felt stable  for discharge. She was tolerating a regular diet, she was ambulating, her incision was clean dry and intact. She has had bowel movements. Her pain was controlled on oral medication. Physical therapy and occupational therapy had evaluated the patient and recommended home health PT and OT  BP 136/75  Pulse 93  Temp(Src) 99.9 F (37.7 C) (Oral)  Resp 18  Ht 5\' 3"  (1.6 m)  Wt 142 lb (64.411 kg)  BMI 25.16 kg/m2  SpO2 94% Alert, no apparent distress Lungs clear to auscultation bilaterally Regular rhythm abd-soft, incision-clean, dry, intact.  Drain-serosanguineous. Staples intact.   Discharge Instructions  Discharge Instructions   Call MD for:  difficulty breathing, headache or visual disturbances    Complete by:  As directed      Call MD for:  persistant dizziness or light-headedness    Complete by:  As directed      Call MD for:  persistant nausea and vomiting    Complete by:  As directed      Call MD for:  redness, tenderness, or signs of infection (pain, swelling, redness, odor or green/yellow discharge around incision site)    Complete by:  As directed      Call MD for:  severe uncontrolled pain    Complete by:  As directed      Call MD for:  temperature >100.4    Complete by:  As directed      Diet Carb Modified    Complete by:  As directed      Discharge instructions    Complete by:  As directed   See [facility] discharge  instructions Empty drain at least once a day - write down how much you empty each time and bring to your appt     Discharge wound care:    Complete by:  As directed   Apply dry gauze to midline incision and change as necessary. Wear abdominal binder for 30 days. May shower     Increase activity slowly    Complete by:  As directed      Lifting restrictions    Complete by:  As directed   Do not lift, push, or pull anything greater than 10 pounds for 5 weeks            Medication List    STOP taking these medications       aspirin EC 81 MG tablet       TAKE these medications       ACCU-CHEK AVIVA PLUS test strip  Generic drug:  glucose blood  TEST ONCE DAILY AS DIRECTED.     ACCU-CHEK FASTCLIX LANCETS Misc  TESTING ONCE DAILY.     alendronate 70 MG tablet  Commonly known as:  FOSAMAX  Take 70 mg by mouth once a week. Wednesdays. Take with a full glass of water on an empty stomach.     amLODipine-valsartan 10-320 MG per tablet  Commonly known as:  EXFORGE  Take 1 tablet by mouth every morning.     CITRACAL + D PO  Take 1 tablet by mouth 2 (two) times daily.     FLAX SEED OIL PO  Take 1 tablet by mouth daily.     folic acid 1 MG tablet  Commonly known as:  FOLVITE  Take 1 mg by mouth daily.     glipiZIDE 10 MG tablet  Commonly known as:  GLUCOTROL  Take 10 mg by mouth 2 (two) times daily.     hydrochlorothiazide 25 MG tablet  Commonly known as:  HYDRODIURIL  Take 25 mg by mouth every morning.     HYDROcodone-acetaminophen 5-325 MG per tablet  Commonly known as:  NORCO/VICODIN  Take 2 tablets by mouth every 4 (four) hours as needed for moderate pain (Pain).     insulin glargine 100 UNIT/ML injection  Commonly known as:  LANTUS  Inject 15 Units into the skin at bedtime.     KOMBIGLYZE XR 2.11-998 MG Tb24  Generic drug:  Saxagliptin-Metformin  Take 1 tablet by mouth 2 (two) times daily.     methotrexate 2.5 MG tablet  Commonly known as:  RHEUMATREX  Take 20 mg by mouth once a week. Fridays. Caution:Chemotherapy. Protect from light.     metoprolol succinate 100 MG 24 hr tablet  Commonly known as:  TOPROL-XL  Take 100 mg by mouth every evening. Take with or immediately following a meal.     MILK THISTLE PO  Take 1 tablet by mouth daily.     multivitamin with minerals Tabs tablet  Take 1 tablet by mouth daily.     pravastatin 20 MG tablet  Commonly known as:  PRAVACHOL  Take 20 mg by mouth daily.     predniSONE 5 MG tablet  Commonly known as:  DELTASONE  Take 10 mg by mouth daily.     vitamin B-12 1000  MCG tablet  Commonly known as:  CYANOCOBALAMIN  Take 1,000 mcg by mouth daily.           Follow-up Information   Follow up with Gayland Curry, MD On 03/02/2014. (5pm (arrive at 4:45 pm) , For  wound re-check)    Specialty:  General Surgery   Contact information:   [place] [address] [phone]       Follow up with [facility] Nurse Rosser On 02/17/2014. (7/28, For suture removal- OUR OFFICE WILL CALL YOU WITH APPT TIME)        The results of significant diagnostics from this hospitalization (including imaging, microbiology, ancillary and laboratory) are listed below for reference.    Significant Diagnostic Studies: Dg Chest 2 View  01/29/2014   CLINICAL DATA:  Preoperative films.  EXAM: CHEST  2 VIEW  COMPARISON:  Single view of the chest 01/29/2012.  FINDINGS: Lungs are clear. Heart size is upper normal. No pneumothorax or pleural fluid. Degenerative disease about the shoulders and lumbar spine noted.  IMPRESSION: No acute disease.   Electronically Signed   By: Inge Rise M.D.   On: 01/29/2014 15:56   Dg Bone Density  01/16/2014   EXAM: DG DEXA BONE DENSITY STUDY  The Bone Mineral Densitometry hard-copy report (which includes all data, graphical display, and FRAX results when applicable) has been sent directly to the ordering physician.  This report can also be obtained electronically by viewing images for this exam through the performing facility's EMR, or by logging directly into [employer].   Electronically Signed   By: Rolm Baptise M.D.   On: 01/16/2014 12:04    Microbiology: No results found for this or any previous visit (from the past 240 hour(s)).   Labs: Basic Metabolic Panel:  Recent Labs Lab 02/07/14 0545 02/10/14 0550  NA 136* 126*  K 4.2 4.1  CL 98 89*  CO2 26 21  GLUCOSE 148* 186*  BUN 13 19  CREATININE 0.72 0.67  CALCIUM 8.9 9.5  MG  --  1.9   Liver Function Tests: No results found for this basename: AST, ALT,  ALKPHOS, BILITOT, PROT, ALBUMIN,  in the last 168 hours No results found for this basename: LIPASE, AMYLASE,  in the last 168 hours No results found for this basename: AMMONIA,  in the last 168 hours CBC:  Recent Labs Lab 02/07/14 0545  WBC 20.2*  HGB 10.9*  HCT 33.3*  MCV 101.5*  PLT 255   Cardiac Enzymes: No results found for this basename: CKTOTAL, CKMB, CKMBINDEX, TROPONINI,  in the last 168 hours BNP: BNP (last 3 results) No results found for this basename: PROBNP,  in the last 8760 hours CBG:  Recent Labs Lab 02/09/14 1228 02/09/14 1653 02/09/14 2141 02/10/14 0742 02/10/14 1116  GLUCAP 237* 209* 185* 167* 194*    Active Problems:   Diabetes mellitus, insulin dependent (IDDM), uncontrolled   DIABETIC PERIPHERAL NEUROPATHY   HYPERTENSION   Foot drop, left   Incisional hernia, incarcerated   Time coordinating discharge: 15 minutes  Signed:  Gayland Curry, MD [facility], [place] [phone] 02/10/2014, 2:49 PM

## 2014-02-10 NOTE — Progress Notes (Signed)
Occupational Therapy Treatment Patient Details Name: Stephanie Mays MRN: [id number] DOB: 1951-03-16 Today's Date: 02/10/2014    History of present illness LAPAROSCOPIC ASSISTED INCARCERATED INCISIONAL HERNIA REPAIR  02/08/14   OT comments  Pt did well with toilet transfer. She has a 3in1 for over the toilet at home and one for [facility] use at night, next to bed. Educated on AE option for LB self care and toilet aid. Pt doing well.   Follow Up Recommendations  Home health OT;Supervision/Assistance - 24 hour    Equipment Recommendations  None recommended by OT    Recommendations for Other Services      Precautions / Restrictions Precautions Precaution Comments: abd drain Required Braces or Orthoses: Other Brace/Splint Other Brace/Splint: abd. binder Restrictions Weight Bearing Restrictions: No       Mobility Bed Mobility                  Transfers Overall transfer level: Needs assistance Equipment used: None Transfers: Sit to/from Stand Sit to Stand: Min guard         General transfer comment: close guard for safety    Balance                                   ADL                       Lower Body Dressing: Supervision/safety;Sitting/lateral leans   Toilet Transfer: Min guard;Stand-pivot;BSC   Toileting- Clothing Manipulation and Hygiene: Min guard;Sit to/from stand         General ADL Comments: Educated pt on toilet aid option  but pt states she feels like she is doing ok with managing toilet hygiene in standing. Explained where to obtain toilet aid if she changes her mind. Also discussed AE for LB self care. She was able to doff and donn R sock but with effort and some strain even with crossign LE up. Explained that crossing LEs up will make it easier than leaning forward toward feet but if she feels it is still a strain to cross LE up, the AE may be helpful. She states her brother has a Secondary school teacher and sock aid likely. She will check with  him. Discussed how to use AE and pt verbalizes understanding. Explained LHS and shoe horn option also. She has an AFO for shoe and may need assist wtih this initially.       Vision               Pain level 3/10 abdominal; reposition.      Perception     Praxis      Cognition   Behavior During Therapy: WFL for tasks assessed/performed Overall Cognitive Status: Within Functional Limits for tasks assessed                       Extremity/Trunk Assessment               Exercises     Shoulder Instructions       General Comments      Pertinent Vitals/ Pain         Home Living                                          Prior Functioning/Environment  Frequency Min 2X/week     Progress Toward Goals  OT Goals(current goals can now be found in the care plan section)  Progress towards OT goals: Progressing toward goals     Plan Discharge plan remains appropriate    Co-evaluation                 End of Session     Activity Tolerance Patient tolerated treatment well   Patient Left in chair;with call bell/phone within reach   Nurse Communication          Time: 1207-1228 OT Time Calculation (min): 21 min  Charges: OT General Charges $OT Visit: 1 Procedure OT Treatments $Therapeutic Activity: 8-22 mins  Jules Schick [phone] 02/10/2014, 12:41 PM

## 2014-02-10 NOTE — Discharge Instructions (Signed)
[facility], [place] [phone]  OPEN ABDOMINAL SURGERY: POST OP INSTRUCTIONS  Always review your discharge instruction sheet given to you by the facility where your surgery was performed.  IF YOU HAVE DISABILITY OR FAMILY LEAVE FORMS, YOU MUST BRING THEM TO THE OFFICE FOR PROCESSING.  PLEASE DO NOT GIVE THEM TO YOUR DOCTOR.  1. A prescription for pain medication may be given to you upon discharge.  Take your pain medication as prescribed, if needed.  If narcotic pain medicine is not needed, then you may take acetaminophen (Tylenol) or ibuprofen (Advil) as needed. 2. Take your usually prescribed medications unless otherwise directed. 3. If you need a refill on your pain medication, please contact your pharmacy. They will contact our office to request authorization.  Prescriptions will not be filled after 5pm or on week-ends. 4. You should follow a light diet the first few days after arrival home, such as soup and crackers, pudding, etc.unless your doctor has advised otherwise. A high-fiber, low fat diet can be resumed as tolerated.   Be sure to include lots of fluids daily.  5. Most patients will experience some swelling and bruising in the area of the incision. Ice pack will help. Swelling and bruising can take several days to resolve..  6. It is common to experience some constipation if taking pain medication after surgery.  Increasing fluid intake and taking a stool softener will usually help or prevent this problem from occurring.  A mild laxative (Milk of Magnesia or Miralax) should be taken according to package directions if there are no bowel movements after 48 hours. 7.  You may have steri-strips (small skin tapes) in place directly over the incision.  These strips should be left on the skin for 7-10 days.  If your surgeon used skin glue on the incision, you may shower in 24 hours.  The glue will flake off over the next 2-3 weeks.  Any sutures or staples will be removed at  the office during your follow-up visit. You may find that a light gauze bandage over your incision may keep your staples from being rubbed or pulled. You may shower and replace the bandage daily. 8. ACTIVITIES:  You may resume regular (light) daily activities beginning the next day--such as daily self-care, walking, climbing stairs--gradually increasing activities as tolerated.  You may have sexual intercourse when it is comfortable.  Refrain from any heavy lifting or straining until approved by your doctor. a. You may drive when you no longer are taking prescription pain medication, you can comfortably wear a seatbelt, and you can safely maneuver your car and apply brakes 9. Return to Work: 55. You should see your doctor in the office for a follow-up appointment approximately two weeks after your surgery.  Make sure that you call for this appointment within a day or two after you arrive home to insure a convenient appointment time. OTHER INSTRUCTIONS:  EMPTY DRAIN EACH DAY AND RECORD HOW MUCH YOU EMPTY AND BRING TO CLINIC APPT WEAR ABDOMINAL BINDER WHILE UP OUT OF BED  WHEN TO CALL YOUR DOCTOR: 1. Fever over 101.0 2. Inability to urinate 3. Nausea and/or vomiting 4. Extreme swelling or bruising 5. Continued bleeding from incision. 6. Increased pain, redness, or drainage from the incision. 7. Difficulty swallowing or breathing 8. Muscle cramping or spasms. 9. Numbness or tingling in hands or feet or around lips.  The clinic staff is available to answer your questions during regular business hours.  Please dont hesitate to call and ask to speak to one of the nurses if you have concerns.  For further questions, please visit [email or web address]

## 2014-02-10 NOTE — Care Management Note (Signed)
    Page 1 of 1   02/10/2014     2:59:27 PM CARE MANAGEMENT NOTE 02/10/2014  Patient:  Stephanie Mays, Stephanie Mays   Account Number:  [id number]  Date Initiated:  02/10/2014  Documentation initiated by:  Dessa Phi  Subjective/Objective Assessment:   63 Y/O F ADMITTED W/INCISIONAL HERNIA.     Action/Plan:   FROM  HOME.   Anticipated DC Date:  02/10/2014   Anticipated DC Plan:  [place]  CM consult      Choice offered to / List presented to:  C-1 Patient        [place] arranged  [place].   Status of service:  Completed, signed off Medicare Important Message given?  YES (If response is "NO", the following Medicare IM given date fields will be blank) Date Medicare IM given:  02/10/2014 Medicare IM given by:  [facility] Date Additional Medicare IM given:   Additional Medicare IM given by:    Discharge Disposition:  [place]  Per UR Regulation:  Reviewed for med. necessity/level of care/duration of stay  If discussed at [place] of Stay Meetings, dates discussed:    Comments:  02/10/14 Michiel Sivley RN,BSN NCM [phone] [place].[facility] CHOSEN.TC KRISTEN [facility] REP AWARE OF D/C & HHC ORDERS.

## 2014-02-12 ENCOUNTER — Other Ambulatory Visit (INDEPENDENT_AMBULATORY_CARE_PROVIDER_SITE_OTHER): Payer: Self-pay

## 2014-02-12 ENCOUNTER — Telehealth: Payer: Self-pay | Admitting: Family Medicine

## 2014-02-12 ENCOUNTER — Telehealth (INDEPENDENT_AMBULATORY_CARE_PROVIDER_SITE_OTHER): Payer: Self-pay | Admitting: General Surgery

## 2014-02-12 ENCOUNTER — Telehealth (INDEPENDENT_AMBULATORY_CARE_PROVIDER_SITE_OTHER): Payer: Self-pay

## 2014-02-12 MED ORDER — ONDANSETRON HCL 4 MG PO TABS: 4.0000 mg | ORAL_TABLET | Freq: Four times a day (QID) | ORAL | Status: AC | PRN

## 2014-02-12 NOTE — Telephone Encounter (Signed)
Pt s/p incarcerated incisional hernia on 02/06/14. Brother states that she has been having some n/v. Brother states that she has been able to keep some foods down. Advised pt that we could call pt in Zofran 4mg  one tablet every 6 hours prn #15 0 refills per protocol. Advised the pt that if n/v continues to give [place] a call back. Pt denies any fevers or chills at this time. Brother verbalized understanding.

## 2014-02-12 NOTE — Telephone Encounter (Signed)
Call in from paramedic stating that patient had passed away, and asked if I would be willing to sign her death certificate. The pt does have several medical conditions, including diabetes which put her at increased risk of heart disease . Pt was d/c from hospital on 02/10/2014 following hernia repair. I spoke directly with her surgeon , Dr Redmond Pulling, and he stated that he is willing to sign her death certificate, and I made the paramedic on site at the patient's home aware, as well as her brother  Who I spoke directly with on the telephone.

## 2014-02-12 NOTE — Telephone Encounter (Signed)
Paged Dr. Redmond Pulling to inform him of the status of patient.

## 2014-02-12 NOTE — Telephone Encounter (Signed)
Amy with [facility] called in to let us know that she just received word that this patient has become deceased as of today.

## 2014-02-13 ENCOUNTER — Telehealth: Payer: Self-pay | Admitting: Family Medicine

## 2014-02-13 ENCOUNTER — Telehealth (INDEPENDENT_AMBULATORY_CARE_PROVIDER_SITE_OTHER): Payer: Self-pay | Admitting: General Surgery

## 2014-02-13 NOTE — Telephone Encounter (Signed)
I spoke directly with Stephanie Mays' brother this morning. He says that after they thought about how quickly Stephanie Mays had died, that they want an autopsy done. I advised that he  speak with Dr Redmond Pulling her surgeon , since Dr Redmond Pulling had stated that he would sign her death certificate. Office staff is to call to Stephanie Mays with Dr Dois Davenport contact number, I will also speak with Dr Redmond Pulling since the family had change of mind overnight as far as wanting an autopsy done, last pm when Dr Redmond Pulling asked if an autopsy was being requested, I had told him no, which the brother asserted this morning was indeed the case at that time

## 2014-02-13 NOTE — Telephone Encounter (Signed)
Stephanie Mays has spoken with Dr Moshe Cipro and Dr Moshe Cipro asked me to give Stephanie Mays the number to Dr Laqueta Linden office spoke with [name] and gave him the telephone number to Dr Laqueta Linden office which is [phone] he will call there to see about the [facility]

## 2014-02-13 NOTE — Telephone Encounter (Signed)
Called and spoke with pt's brother after learning pt passed away yesterday. Offered my condolences. Pt's brother offered few details. Pt's brother now requests autopsy. Explained to my knowledge that her pcp was getting intouch with the funeral home to alert the medical examiner that family wanted to know what happened. Offered my assistance in any way.

## 2014-02-13 NOTE — Telephone Encounter (Addendum)
Direct contact made with patient's brother. I have been attempting to arrange the autopsy that has been requested by the family. I spoke with Mr Wynona Canes the name provided by Th ED physician at [facility] , to contact re autopsy request. Mr Brien Mates advised that since the body was already outside of the hospital, then he would not be able to do the autopsy, he stated that in this case , the body would need to be taken to [facility] where private autopsies are done. I made the family aware of the process, and contacted risk management for assistance.I was advised to ensure that family was made aware of the need to let the funeral home know of their decision to have an autopsy, which I have done Later contact from risk management advised that the family has to make direct contact with facilities which will do a private autopsy, [facility] [phone], and [facility] [phone]. These numbers have been provided to her brother Jeneen Rinks, he asked if the cost was already covered, I told him "No" and that the family would have to cover the cost, he said thank you for the information, and that he would let his family know.

## 2014-02-14 DIAGNOSIS — K432 Incisional hernia without obstruction or gangrene: Secondary | ICD-10-CM | POA: Insufficient documentation

## 2014-02-14 NOTE — Progress Notes (Signed)
   Subjective:    Patient ID: Stephanie Mays, female    DOB: May 28, 1951, 63 y.o.   MRN: [id number]  HPI The PT is here for follow up and re-evaluation of chronic medical conditions, medication management and review of any available recent lab and radiology data.  Preventive health is updated, specifically  Cancer screening and Immunization.    The PT denies any adverse reactions to current medications since the last visit.  There are no new concerns.  There are no specific complaints  She denies hypoglycemic episodes, polyuria, or polydipsia. Fasting sugars range between 130 to 150. Plans to have abdominal hernia repair in the near future, she recently had the complication of incarceration of one of her hernias which spontaneously resolved, needs  referral to have this done  Review of Systems See HPI Denies recent fever or chills. Denies sinus pressure, nasal congestion, ear pain or sore throat. Denies chest congestion, productive cough or wheezing. Denies chest pains, palpitations and leg swelling Denies abdominal pain, nausea, vomiting,diarrhea or constipation.   Denies dysuria, frequency, hesitancy or incontinence. Chronic  joint pain, swelling and limitation in mobility. Denies headaches, seizures, numbness, or tingling. Denies depression, anxiety or insomnia. Denies skin break down or rash.        Objective:   Physical Exam BP 126/78  Pulse 68  Resp 18  Ht 5' 2.5" (1.588 m)  Wt 141 lb (63.957 kg)  BMI 25.36 kg/m2  SpO2 97% Patient alert and oriented and in no cardiopulmonary distress.  HEENT: No facial asymmetry, EOMI,   oropharynx pink and moist.  Neck supple no JVD, no mass.  Chest: Clear to auscultation bilaterally.  CVS: S1, S2 no murmurs, no S3.Regular rate.  ABD: Soft non tender.   Ext: No edema  MS: Adequate though limited ROM spine, shoulders, hips and knees.deformity of digits due to severe rheumatoid disease  Skin: Intact, no ulcerations or rash  noted.  Psych: Good eye contact, normal affect. Memory intact not anxious or depressed appearing.  CNS: CN 2-12 intact, power,  normal throughout.no focal deficits noted.        Assessment & Plan:  Diabetes mellitus, insulin dependent (IDDM), controlled Controlled, no change in medication Patient advised to reduce carb and sweets, commit to regular physical activity, take meds as prescribed, test blood as directed, and attempt to lose weight, to improve blood sugar control.   Hyperlipidemia with target LDL less than 100 Controlled, no change in medication Hyperlipidemia:Low fat diet discussed and encouraged.  Updated lab needed  before next visit.   ARTHRITIS, RHEUMATOID Stable and followed by rheumatology  OSTEOPENIA Updated  Bone scan needed, will recommend treatment with bisphosphonate most likely, already a candidate for this therapy if able to tolerate    HYPERTENSION Controlled, no change in medication   Incisional hernia, without obstruction or gangrene Pt with multiple abdominal hernias, now decided on surgical repair, following recent presentation to the ED with acute obstruction which spontaneously resolved, will refer to surgery for definitive repair

## 2014-02-14 NOTE — Assessment & Plan Note (Addendum)
Pt with multiple abdominal hernias, now decided on surgical repair, following recent presentation to the ED with acute obstruction which spontaneously resolved, will refer to surgery for definitive repair

## 2014-02-14 NOTE — Assessment & Plan Note (Signed)
Controlled, no change in medication Hyperlipidemia:Low fat diet discussed and encouraged.  Updated lab needed  before next visit.

## 2014-02-14 NOTE — Assessment & Plan Note (Signed)
Updated  Bone scan needed, will recommend treatment with bisphosphonate most likely, already a candidate for this therapy if able to tolerate

## 2014-02-14 NOTE — Assessment & Plan Note (Signed)
Stable and followed by rheumatology

## 2014-02-14 NOTE — Assessment & Plan Note (Signed)
Controlled, no change in medication  

## 2014-02-14 NOTE — Assessment & Plan Note (Signed)
Controlled, no change in medication Patient advised to reduce carb and sweets, commit to regular physical activity, take meds as prescribed, test blood as directed, and attempt to lose weight, to improve blood sugar control.  

## 2014-02-17 ENCOUNTER — Encounter (INDEPENDENT_AMBULATORY_CARE_PROVIDER_SITE_OTHER): Payer: Medicare Other

## 2014-02-21 DEATH — deceased

## 2014-02-26 DIAGNOSIS — Z48815 Encounter for surgical aftercare following surgery on the digestive system: Secondary | ICD-10-CM

## 2014-02-26 DIAGNOSIS — E119 Type 2 diabetes mellitus without complications: Secondary | ICD-10-CM

## 2014-02-26 DIAGNOSIS — Z48814 Encounter for surgical aftercare following surgery on the teeth or oral cavity: Secondary | ICD-10-CM

## 2014-02-26 DIAGNOSIS — Z794 Long term (current) use of insulin: Secondary | ICD-10-CM

## 2014-02-26 DIAGNOSIS — I10 Essential (primary) hypertension: Secondary | ICD-10-CM

## 2014-02-26 DIAGNOSIS — IMO0001 Reserved for inherently not codable concepts without codable children: Secondary | ICD-10-CM

## 2014-02-27 ENCOUNTER — Telehealth: Payer: Self-pay | Admitting: Family Medicine

## 2014-02-27 NOTE — Telephone Encounter (Signed)
I responded to a call from Dr Deon Pilling earlier today , before noon,to  discusss the situation with regard to Ms Schmall' death certificate, which had still not been signed.  I was made aware only yesterday, that this was  an issue, and had been awaiting word from the legal dept /risk management , through my manger, Carmela Hurt, as to what action I needed to take up until the close of the regular work day on 02/26/2014. I stated at  that time that I would do whatever was suggested to be most appropriate in the current circumstance. Dr Deon Pilling pre empted the discussion by stating clearly that he was  "not advising me what to do" he did however, suggest that it would be beneficial if I would sign the death certificate. He further guided me as to an appropriate manner of documentation , with which  I agreed.  Cause of death would be "undetermined, possible cardiovascular disease" Listed among contributing causes, 'Rescent abdominal surgery" would be listed, along with her other co morbidities,  I immediately called my manager, Carmela Hurt, after speaking with Dr Deon Pilling, to ask that she inform the funeral home , that I would sign the death certificate. Ms [place] Crane called me back shortly after to inform me that staff at the funeral home advised her that Ms Chowning' surgeon, had agreed to sign the death certificate and that they would be taking it for him to do so

## 2014-03-02 ENCOUNTER — Encounter (INDEPENDENT_AMBULATORY_CARE_PROVIDER_SITE_OTHER): Payer: Medicare Other | Admitting: General Surgery

## 2014-03-27 ENCOUNTER — Ambulatory Visit (HOSPITAL_COMMUNITY): Payer: Medicare Other

## 2014-05-04 IMAGING — CT CT ABD-PELV W/ CM
2 of 4 series · 15 of 46 positions shown, 17 images · IV contrast (Omnipaque 300)
Comparison: 05/17/2011.

CLINICAL DATA: Periumbilical abdominal pain and nausea. Previous
hysterectomy.

EXAM:
CT ABDOMEN AND PELVIS WITH CONTRAST
TECHNIQUE: Multidetector CT imaging of the abdomen and pelvis was performed
using the standard protocol following bolus administration of
intravenous contrast.
CONTRAST:  100mL OMNIPAQUE IOHEXOL 300 MG/ML SOLN, 50mL OMNIPAQUE
IOHEXOL 300 MG/ML SOLN

[Series 2: abd_pel_with 5.0 b40f · axial · 0.72mm/px · z∈[-440,-4]mm · 12 of 97 slices shown, 14 images]
[im 5/97  soft-tissue]
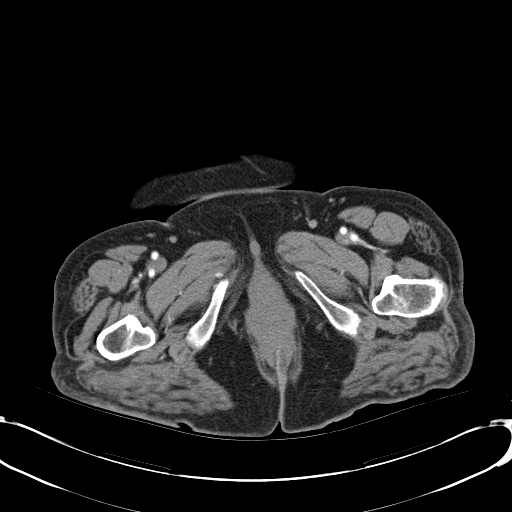
[im 5/97  bone]
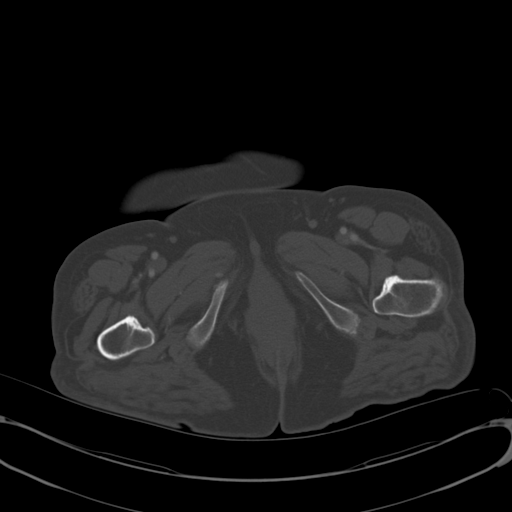
[im 14/97  soft-tissue]
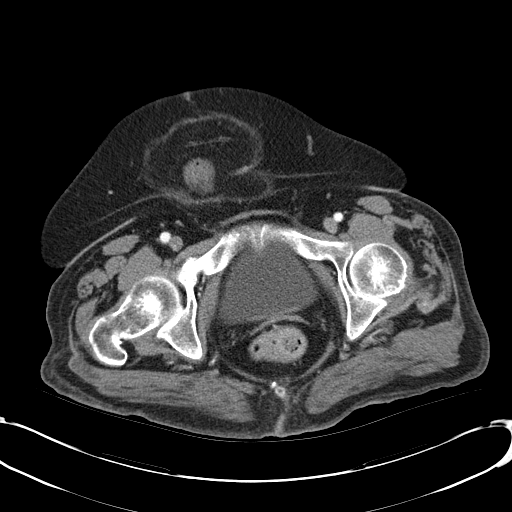
[im 22/97  soft-tissue]
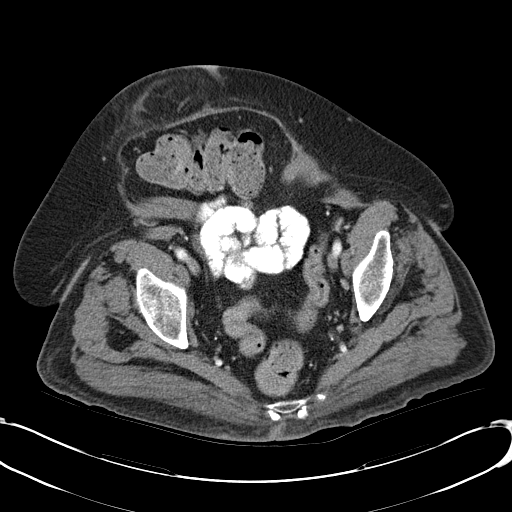
[im 31/97  soft-tissue]
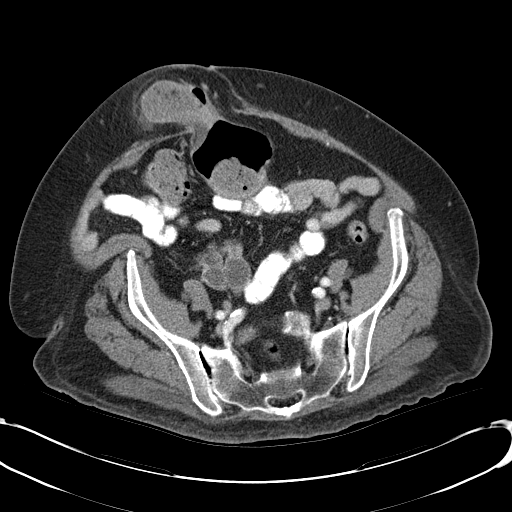
[im 35/97  soft-tissue]
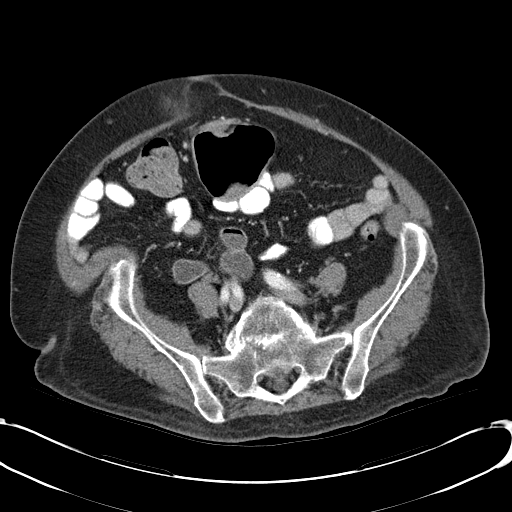
[im 44/97  soft-tissue]
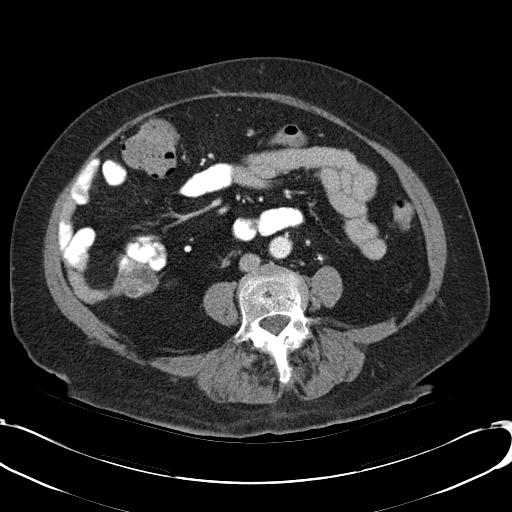
[im 53/97  soft-tissue]
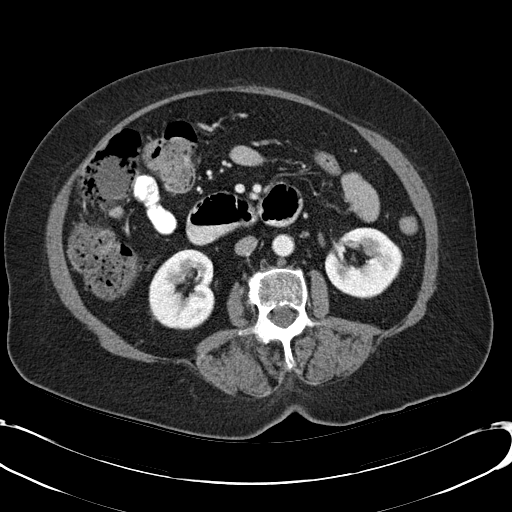
[im 62/97  soft-tissue]
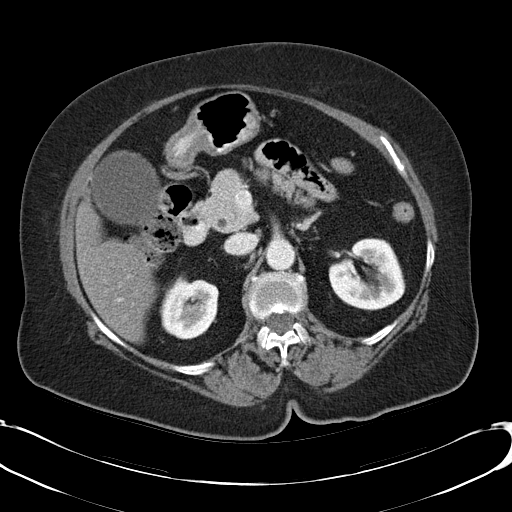
[im 66/97  soft-tissue]
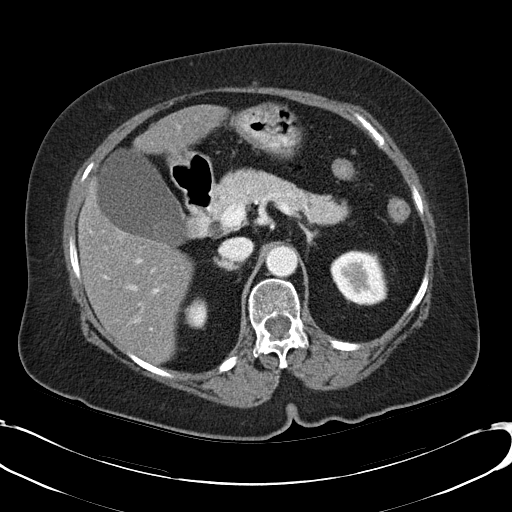
[im 66/97  bone]
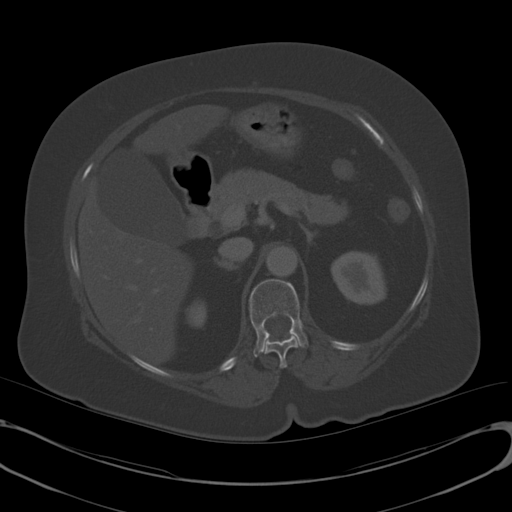
[im 75/97  soft-tissue]
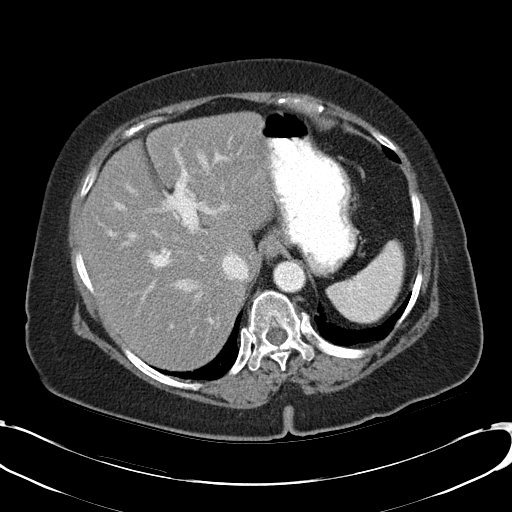
[im 83/97  soft-tissue]
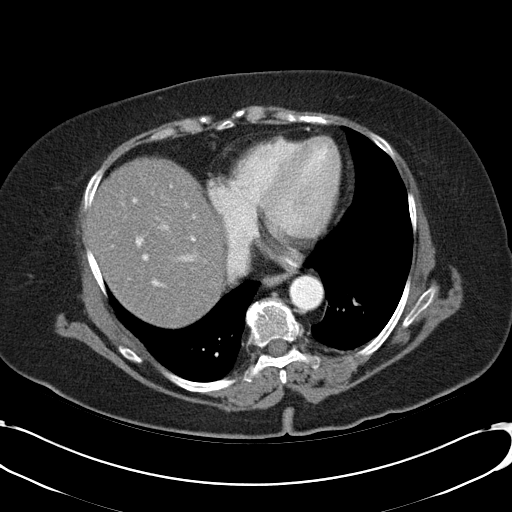
[im 92/97  soft-tissue]
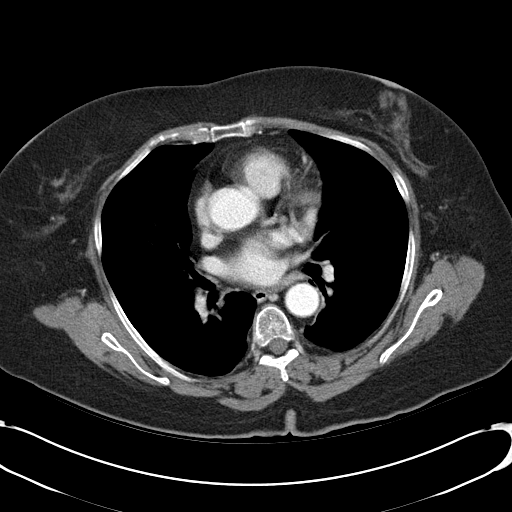

[Series 4: abd_pel_with 3.0 spo · coronal · 0.67mm/px · 3 of 85 slices shown]
[im 29/85  soft-tissue]
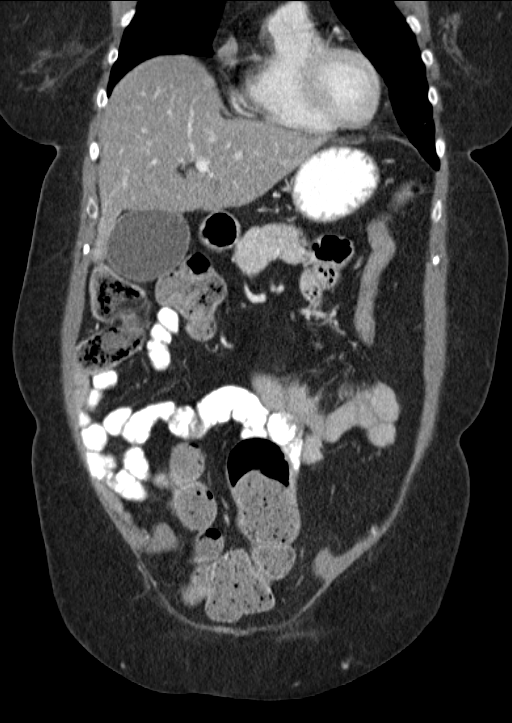
[im 38/85  soft-tissue]
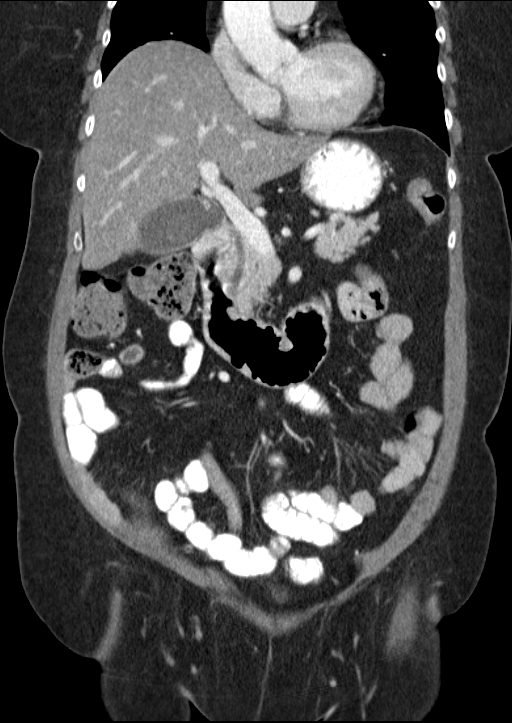
[im 47/85  soft-tissue]
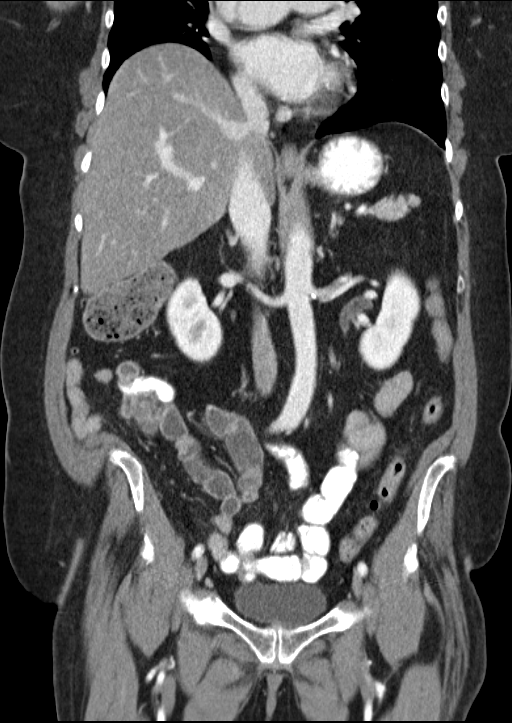

[15 of 46 positions shown; findings below may reference images not displayed]

FINDINGS: A small umbilical hernia containing fat is again demonstrated.

Immediately Inferior to this small umbilical hernia, the previously
demonstrated right ventral hernia containing fat demonstrates
interval herniation of a portion of the transverse colon. There is
mild diffuse wall thickening and enhancement as the colon passes
through the hernia defect into the hernia sac. The colon leading
into this defect is mildly dilated and filled with stool. The colon
distal to the hernia is small in caliber, without stool. There is
some stool in rectum. There is an interval small amount of fluid
within this hernia sac.

At medially inferior to the hernia containing interval colon, a
larger hernia is again demonstrated containing the transverse colon
leading into the middle hernia.

No small bowel dilatation. No enlarged lymph nodes. Normal appearing
appendix. Stable diffuse low density of the liver relative to the
spleen. Normal appearing spleen, pancreas, adrenal glands, kidneys
and urinary bladder. Surgically absent uterus. No adnexal masses or
enlarged lymph nodes. Distended gallbladder. Mild atheromatous
coronary artery calcifications. Minimal dependent atelectasis at
both lung bases.

Interval healing right inferior and superior pubic ramus fractures.
No significant change in old bilateral L5 pars interarticularis
defects with associated grade 2 spondylolisthesis at the L5-S1
level. There is partial bone fusion at that level.
IMPRESSION: 1. Small umbilical hernia with 2 previously demonstrated larger
hernias inferior to the umbilicus. The middle hernia demonstrate
interval herniated transverse colon, causing at least partial
obstruction of the colon. There is mild associated edema within the
hernia sac.
2. Transverse colon continues to extend into the larger, most
inferior hernia sac without obstruction.
3. Interval healing right superior and inferior pubic ramus
fractures.
4. Stable bilateral L5 spondylolysis with associated grade 2
spondylolisthesis at the L5-S1 level with partial bone fusion.
5. Stable diffuse hepatic steatosis.
6. Mild atheromatous coronary artery calcifications.

## 2014-05-12 ENCOUNTER — Encounter: Payer: Medicare Other | Admitting: Family Medicine

## 2014-07-29 ENCOUNTER — Other Ambulatory Visit (HOSPITAL_COMMUNITY): Payer: Medicare Other

## 2014-07-30 ENCOUNTER — Ambulatory Visit (HOSPITAL_COMMUNITY): Payer: Medicare Other

## 2016-08-22 ENCOUNTER — Encounter: Payer: Self-pay | Admitting: Internal Medicine
